# Patient Record
Sex: Male | Born: 1975 | Race: Black or African American | Hispanic: No | Marital: Single | Smoking: Current some day smoker
Health system: Southern US, Community
[De-identification: ages and names within clinical notes are randomized; demographics above are authoritative.]

## PROBLEM LIST (undated history)

## (undated) DIAGNOSIS — Z720 Tobacco use: Secondary | ICD-10-CM

## (undated) DIAGNOSIS — M549 Dorsalgia, unspecified: Secondary | ICD-10-CM

## (undated) DIAGNOSIS — I2699 Other pulmonary embolism without acute cor pulmonale: Secondary | ICD-10-CM

## (undated) HISTORY — DX: Tobacco use: Z72.0

## (undated) HISTORY — DX: Other pulmonary embolism without acute cor pulmonale: I26.99

## (undated) HISTORY — DX: Dorsalgia, unspecified: M54.9

## (undated) HISTORY — PX: CYST REMOVAL HAND: SHX6279

---

## 1998-05-05 ENCOUNTER — Encounter: Payer: Self-pay | Admitting: Emergency Medicine

## 1998-05-05 ENCOUNTER — Emergency Department (HOSPITAL_COMMUNITY): Admission: EM | Admit: 1998-05-05 | Discharge: 1998-05-05 | Payer: Self-pay | Admitting: Emergency Medicine

## 2000-06-18 ENCOUNTER — Ambulatory Visit (HOSPITAL_BASED_OUTPATIENT_CLINIC_OR_DEPARTMENT_OTHER): Admission: RE | Admit: 2000-06-18 | Discharge: 2000-06-18 | Payer: Self-pay | Admitting: Orthopedic Surgery

## 2000-06-18 ENCOUNTER — Encounter (INDEPENDENT_AMBULATORY_CARE_PROVIDER_SITE_OTHER): Payer: Self-pay | Admitting: Specialist

## 2004-01-21 DIAGNOSIS — I2699 Other pulmonary embolism without acute cor pulmonale: Secondary | ICD-10-CM

## 2004-01-21 HISTORY — DX: Other pulmonary embolism without acute cor pulmonale: I26.99

## 2007-06-16 ENCOUNTER — Emergency Department (HOSPITAL_COMMUNITY): Admission: EM | Admit: 2007-06-16 | Discharge: 2007-06-16 | Payer: Self-pay | Admitting: Emergency Medicine

## 2007-07-02 ENCOUNTER — Emergency Department (HOSPITAL_COMMUNITY): Admission: EM | Admit: 2007-07-02 | Discharge: 2007-07-02 | Payer: Self-pay | Admitting: Emergency Medicine

## 2010-08-01 ENCOUNTER — Emergency Department (HOSPITAL_BASED_OUTPATIENT_CLINIC_OR_DEPARTMENT_OTHER)
Admission: EM | Admit: 2010-08-01 | Discharge: 2010-08-01 | Disposition: A | Discharge status: Home / Self Care | Payer: No Typology Code available for payment source | Attending: Emergency Medicine | Admitting: Emergency Medicine

## 2010-08-01 DIAGNOSIS — S335XXA Sprain of ligaments of lumbar spine, initial encounter: Secondary | ICD-10-CM | POA: Insufficient documentation

## 2010-08-01 DIAGNOSIS — S139XXA Sprain of joints and ligaments of unspecified parts of neck, initial encounter: Secondary | ICD-10-CM | POA: Diagnosis not present

## 2010-08-01 DIAGNOSIS — S161XXA Strain of muscle, fascia and tendon at neck level, initial encounter: Secondary | ICD-10-CM

## 2010-08-01 DIAGNOSIS — M542 Cervicalgia: Secondary | ICD-10-CM | POA: Diagnosis present

## 2010-08-01 DIAGNOSIS — S39012A Strain of muscle, fascia and tendon of lower back, initial encounter: Secondary | ICD-10-CM

## 2010-08-01 DIAGNOSIS — Y92410 Unspecified street and highway as the place of occurrence of the external cause: Secondary | ICD-10-CM | POA: Insufficient documentation

## 2010-08-01 MED ORDER — CYCLOBENZAPRINE HCL 10 MG PO TABS: 10.0000 mg | ORAL_TABLET | Freq: Two times a day (BID) | ORAL | Status: AC | PRN

## 2010-08-01 MED ORDER — IBUPROFEN 800 MG PO TABS: 800.0000 mg | ORAL_TABLET | Freq: Three times a day (TID) | ORAL | Status: AC

## 2010-08-01 NOTE — ED Notes (Signed)
Awaiting MD evaluation. Warm blankets provided.
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
## 2011-05-31 ENCOUNTER — Emergency Department (HOSPITAL_BASED_OUTPATIENT_CLINIC_OR_DEPARTMENT_OTHER)
Admission: EM | Admit: 2011-05-31 | Discharge: 2011-05-31 | Disposition: A | Discharge status: Home Health | Payer: Managed Care, Other (non HMO) | Attending: Emergency Medicine | Admitting: Emergency Medicine

## 2011-05-31 ENCOUNTER — Emergency Department (INDEPENDENT_AMBULATORY_CARE_PROVIDER_SITE_OTHER): Payer: Managed Care, Other (non HMO)

## 2011-05-31 ENCOUNTER — Encounter (HOSPITAL_BASED_OUTPATIENT_CLINIC_OR_DEPARTMENT_OTHER): Payer: Self-pay | Admitting: *Deleted

## 2011-05-31 DIAGNOSIS — M25579 Pain in unspecified ankle and joints of unspecified foot: Secondary | ICD-10-CM

## 2011-05-31 DIAGNOSIS — R0602 Shortness of breath: Secondary | ICD-10-CM | POA: Insufficient documentation

## 2011-05-31 DIAGNOSIS — R0789 Other chest pain: Secondary | ICD-10-CM

## 2011-05-31 DIAGNOSIS — IMO0002 Reserved for concepts with insufficient information to code with codable children: Secondary | ICD-10-CM | POA: Insufficient documentation

## 2011-05-31 DIAGNOSIS — Y998 Other external cause status: Secondary | ICD-10-CM | POA: Insufficient documentation

## 2011-05-31 DIAGNOSIS — M7989 Other specified soft tissue disorders: Secondary | ICD-10-CM

## 2011-05-31 DIAGNOSIS — S90519A Abrasion, unspecified ankle, initial encounter: Secondary | ICD-10-CM

## 2011-05-31 DIAGNOSIS — R071 Chest pain on breathing: Secondary | ICD-10-CM | POA: Insufficient documentation

## 2011-05-31 DIAGNOSIS — R079 Chest pain, unspecified: Secondary | ICD-10-CM

## 2011-05-31 DIAGNOSIS — Y92410 Unspecified street and highway as the place of occurrence of the external cause: Secondary | ICD-10-CM | POA: Insufficient documentation

## 2011-05-31 MED ORDER — IBUPROFEN 800 MG PO TABS
800.0000 mg | ORAL_TABLET | Freq: Once | ORAL | Status: AC
  Administered 2011-05-31: 800 mg via ORAL
  Filled 2011-05-31: qty 1

## 2011-05-31 MED ORDER — TETANUS-DIPHTH-ACELL PERTUSSIS 5-2.5-18.5 LF-MCG/0.5 IM SUSP
0.5000 mL | Freq: Once | INTRAMUSCULAR | Status: AC
  Administered 2011-05-31: 0.5 mL via INTRAMUSCULAR
  Filled 2011-05-31: qty 0.5

## 2011-05-31 NOTE — Discharge Instructions (Signed)
Abrasions Abrasions are skin scrapes. Their treatment depends on how large and deep the abrasion is. Abrasions do not extend through all layers of the skin. A cut or lesion through all skin layers is called a laceration. HOME CARE INSTRUCTIONS   If you were given a dressing, change it at least once a day or as instructed by your caregiver. If the bandage sticks, soak it off with a solution of water or hydrogen peroxide.   Twice a day, wash the area with soap and water to remove all the cream/ointment. You may do this in a sink, under a tub faucet, or in a shower. Rinse off the soap and pat dry with a clean towel. Look for signs of infection (see below).   Reapply cream/ointment according to your caregiver's instruction. This will help prevent infection and keep the bandage from sticking. Telfa or gauze over the wound and under the dressing or wrap will also help keep the bandage from sticking.   If the bandage becomes wet, dirty, or develops a foul smell, change it as soon as possible.   Only take over-the-counter or prescription medicines for pain, discomfort, or fever as directed by your caregiver.  SEEK IMMEDIATE MEDICAL CARE IF:   Increasing pain in the wound.   Signs of infection develop: redness, swelling, surrounding area is tender to touch, or pus coming from the wound.   You have a fever.   Any foul smell coming from the wound or dressing.  Most skin wounds heal within ten days. Facial wounds heal faster. However, an infection may occur despite proper treatment. You should have the wound checked for signs of infection within 24 to 48 hours or sooner if problems arise. If you were not given a wound-check appointment, look closely at the wound yourself on the second day for early signs of infection listed above. MAKE SURE YOU:   Understand these instructions.   Will watch your condition.   Will get help right away if you are not doing well or get worse.  Document Released:  10/16/2004 Document Revised: 12/26/2010 Document Reviewed: 12/10/2010 [facility] Patient Information 2012 [place], [place].Chest Wall Pain Chest wall pain is pain in or around the bones and muscles of your chest. It may take up to 6 weeks to get better. It may take longer if you must stay physically active in your work and activities.  CAUSES  Chest wall pain may happen on its own. However, it may be caused by:  A viral illness like the flu.   Injury.   Coughing.   Exercise.   Arthritis.   Fibromyalgia.   Shingles.  HOME CARE INSTRUCTIONS   Avoid overtiring physical activity. Try not to strain or perform activities that cause pain. This includes any activities using your chest or your abdominal and side muscles, especially if heavy weights are used.   Put ice on the sore area.   Put ice in a plastic bag.   Place a towel between your skin and the bag.   Leave the ice on for 15 to 20 minutes per hour while awake for the first 2 days.   Only take over-the-counter or prescription medicines for pain, discomfort, or fever as directed by your caregiver.  SEEK IMMEDIATE MEDICAL CARE IF:   Your pain increases, or you are very uncomfortable.   You have a fever.   Your chest pain becomes worse.   You have new, unexplained symptoms.   You have nausea or vomiting.   You  feel sweaty or lightheaded.   You have a cough with phlegm (sputum), or you cough up blood.  MAKE SURE YOU:   Understand these instructions.   Will watch your condition.   Will get help right away if you are not doing well or get worse.  Document Released: 01/06/2005 Document Revised: 12/26/2010 Document Reviewed: 09/02/2010 [facility] Patient Information 2012 [place], [place].

## 2011-05-31 NOTE — ED Notes (Signed)
Patient transported to X-ray 

## 2011-05-31 NOTE — ED Notes (Signed)
The patient is undressed and in a gown. The bed rail is up and the call light is within reach. The bed is locked and in the lowest position. The patient is resting at this time.
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
## 2011-12-12 ENCOUNTER — Ambulatory Visit: Payer: Managed Care, Other (non HMO)

## 2011-12-12 ENCOUNTER — Ambulatory Visit (INDEPENDENT_AMBULATORY_CARE_PROVIDER_SITE_OTHER): Payer: Managed Care, Other (non HMO) | Admitting: Family Medicine

## 2011-12-12 VITALS — BP 126/80 | HR 69 | Temp 98.2°F | Resp 16 | Ht 68.5 in | Wt 168.6 lb

## 2011-12-12 DIAGNOSIS — S93409A Sprain of unspecified ligament of unspecified ankle, initial encounter: Secondary | ICD-10-CM

## 2011-12-12 DIAGNOSIS — M25579 Pain in unspecified ankle and joints of unspecified foot: Secondary | ICD-10-CM

## 2011-12-12 NOTE — Patient Instructions (Addendum)
You have a sprained ankle.  Wear the air cast as needed, and let us know if you are not getting better over the next several days.

## 2011-12-12 NOTE — Progress Notes (Signed)
[facility] [address], [place] [address] [phone]  Date:  12/12/2011   Name:  David Barker   DOB:  02-08-1975   MRN:  [id number]  PCP:  No primary provider on file.    Chief Complaint: Ankle Injury   History of Present Illness:  David Barker is a 36 y.o. very pleasant male patient who presents with the following:  About 2 weeks ago he inverted his left ankle on a step at his home.  He could not bear weight very easily but he did go to work after he hurt his ankle.  The ankle swelled but did not bruise.  It is still swollen.  He has had ankle sprains in the past.  Some have taken some time to heal.   He does have pain while he was working- by the end of his 12 hour shift the ankle is quite painful.    He works at [employer].  He has to carry heavy objects and walk a lot at his job.  When he is off the ankle it is tender but not too bad.    He is otherwise well and healthy   There is no problem list on file for this patient.   History reviewed. No pertinent past medical history.  History reviewed. No pertinent past surgical history.  History  Substance Use Topics  . Smoking status: Current Some Day Smoker  . Smokeless tobacco: Not on file  . Alcohol Use: Yes     Comment: occasional    Family History  Problem Relation Age of Onset  . Hypertension Paternal Grandmother     No Known Allergies  Medication list has been reviewed and updated.  Current Outpatient Prescriptions on File Prior to Visit  Medication Sig Dispense Refill  . ibuprofen (ADVIL,MOTRIN) 200 MG tablet Take 400 mg by mouth every 6 (six) hours as needed. Patient used this medication for pain.        Review of Systems:  As per HPI- otherwise negative.   Physical Examination: Filed Vitals:   12/12/11 1615  BP: 126/80  Pulse: 69  Temp: 98.2 F (36.8 C)  Resp: 16   Filed Vitals:   12/12/11 1615  Height: 5' 8.5" (1.74 m)  Weight: 168 lb 9.6 oz (76.476  kg)   Body mass index is 25.26 kg/(m^2). Ideal Body Weight: Weight in (lb) to have BMI = 25: 166.5    GEN: WDWN, NAD, Non-toxic, Alert & Oriented x 3 HEENT: Atraumatic, Normocephalic.  Ears and Nose: No external deformity. EXTR: No clubbing/cyanosis/edema NEURO: minimal favoring of left ankle PSYCH: Normally interactive. Conversant. Not depressed or anxious appearing.  Calm demeanor.  Left ankle: there is tenderness and mild swelling over the lateral malleolus.  No bruise.  No tenderness over the foot.  Normal foot perfusion, sensation and cap refill.  Normal DP pulse  [facility] reading (PRIMARY) by  Dr. Patsy Lager. Left ankle: negative Left foot: negative  LEFT FOOT - 2 VIEW  Comparison: The ankle films same date  Findings: AP and lateral views of the left foot. No oblique image submitted.  Given this mild limitation, no acute fracture or dislocation is identified. The base of the fifth metatarsal is intact.  IMPRESSION: No acute osseous abnormality. Slightly decreased sensitivity secondary to lack of oblique image.  LEFT ANKLE COMPLETE - 3+ VIEW  Comparison: None.  Findings: There is some soft tissue swelling about the lateral malleolus. No underlying bony or joint abnormality  is identified.  IMPRESSION: Soft tissue swelling without underlying bony or joint abnormality.   Assessment and Plan: 1. Ankle sprain  DG Ankle Complete Left, DG Foot 2 Views Left  2. Ankle pain     Placed air cast for support and did a note for work- he states that he will have to go out on FMLA and that requires a certain number of days out of work.  Wrote note for him to return to work on 12/23/11.  See patient instructions for more details.     Abbe Amsterdam, MD

## 2011-12-26 ENCOUNTER — Ambulatory Visit (INDEPENDENT_AMBULATORY_CARE_PROVIDER_SITE_OTHER): Payer: Managed Care, Other (non HMO) | Admitting: Family Medicine

## 2011-12-26 VITALS — BP 128/82 | HR 68 | Temp 98.1°F | Resp 16 | Ht 69.0 in | Wt 172.0 lb

## 2011-12-26 DIAGNOSIS — S93409A Sprain of unspecified ligament of unspecified ankle, initial encounter: Secondary | ICD-10-CM

## 2011-12-26 NOTE — Progress Notes (Signed)
[facility] [address], [place] [address] [phone]  Date:  12/26/2011   Name:  David Barker   DOB:  1975-09-19   MRN:  [id number]  PCP:  No primary provider on file.    Chief Complaint: Ankle Pain   History of Present Illness:  David Barker is a 36 y.o. very pleasant male patient who presents with the following:  He was seen here on 11/22 about 2 weeks after he had an ankle inversion injury.  The ankle was still swollen and it was hard to walk.  He had negative x-rays performed and was placed in an air- cast.  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  He continues to have pain and swelling  He has sprained his ankle in the past but never so severely.  He is trying to stay off of the ankle and is wearing his air- cast.     There is no problem list on file for this patient.   No past medical history on file.  No past surgical history on file.  History  Substance Use Topics  . Smoking status: Current Some Day Smoker  . Smokeless tobacco: Not on file  . Alcohol Use: Yes     Comment: occasional    Family History  Problem Relation Age of Onset  . Hypertension Paternal Grandmother     No Known Allergies  Medication list has been reviewed and updated.  Current Outpatient Prescriptions on File Prior to Visit  Medication Sig Dispense Refill  . ibuprofen (ADVIL,MOTRIN) 200 MG tablet Take 400 mg by mouth every 6 (six) hours as needed. Patient used this medication for pain.        Review of Systems:  As per HPI- otherwise negative.   Physical Examination: Filed Vitals:   12/26/11 1111  BP: 128/82  Pulse: 68  Temp: 98.1 F (36.7 C)  Resp: 16   Filed Vitals:   12/26/11 1111  Height: 5\' 9"  (1.753 m)  Weight: 172 lb (78.019 kg)   Body mass index is 25.40 kg/(m^2). Ideal Body Weight: Weight in (lb) to  have BMI = 25: 168.9    GEN: WDWN, NAD, Non-toxic, Alert & Oriented x 3 HEENT: Atraumatic, Normocephalic.  Ears and Nose: No external deformity. EXTR: No clubbing/cyanosis/edema NEURO: Normal gait.  PSYCH: Normally interactive. Conversant. Not depressed or anxious appearing.  Calm demeanor.  Left ankle: he continues to have tenderness and mild swelling over his lateral ankle, positive talar tilt test.  No bruise, normal perfusion, normal movement of foot and toes.  Achilles intact, normal flexion and extension of the ankle.    Assessment and Plan: 1. Ankle sprain  Ambulatory referral to Orthopedic Surgery   Persistent symptoms a month after an ankle sprain/ inversion injury. He is not able to RTW as his job is very physically demanding and he does not feel his ankle is stable for this type of work.  Will refer to ortho for further evaluation/ PT, continue air cast  Oskar Cretella, MD

## 2011-12-26 NOTE — Patient Instructions (Addendum)
We are going to refer you to orthopedics for further treatment of your ankle.  In the meantime continue to take it easy and wear your air- cast.  Let us know if anything gets worse

## 2012-07-30 ENCOUNTER — Ambulatory Visit (INDEPENDENT_AMBULATORY_CARE_PROVIDER_SITE_OTHER): Payer: Managed Care, Other (non HMO) | Admitting: Family Medicine

## 2012-07-30 VITALS — BP 122/74 | HR 71 | Temp 98.5°F | Resp 16 | Ht 69.5 in | Wt 162.0 lb

## 2012-07-30 DIAGNOSIS — M533 Sacrococcygeal disorders, not elsewhere classified: Secondary | ICD-10-CM

## 2012-07-30 DIAGNOSIS — S39012A Strain of muscle, fascia and tendon of lower back, initial encounter: Secondary | ICD-10-CM

## 2012-07-30 MED ORDER — IBUPROFEN 800 MG PO TABS: ORAL_TABLET | ORAL | Status: DC

## 2012-07-30 MED ORDER — METHOCARBAMOL 750 MG PO TABS: ORAL_TABLET | ORAL | Status: DC

## 2012-07-30 MED ORDER — HYDROCODONE-ACETAMINOPHEN 5-325 MG PO TABS: ORAL_TABLET | ORAL | Status: DC

## 2012-07-30 NOTE — Progress Notes (Signed)
Subjective

## 2012-07-30 NOTE — Patient Instructions (Signed)
Read the booklet on care of your back  Take ibuprofen 3 times daily  Take the muscle relaxants, methocarbamol, one in the morning, one in the afternoon, and 2 at bedtime  Use the hydrocodone for severe pain  Return in a week if not improved

## 2012-11-12 ENCOUNTER — Ambulatory Visit (INDEPENDENT_AMBULATORY_CARE_PROVIDER_SITE_OTHER): Payer: Managed Care, Other (non HMO) | Admitting: Emergency Medicine

## 2012-11-12 VITALS — BP 148/82 | HR 80 | Temp 98.5°F | Resp 16 | Ht 68.0 in | Wt 161.0 lb

## 2012-11-12 DIAGNOSIS — R300 Dysuria: Secondary | ICD-10-CM

## 2012-11-12 DIAGNOSIS — R369 Urethral discharge, unspecified: Secondary | ICD-10-CM

## 2012-11-12 LAB — POCT URINALYSIS DIPSTICK
Bilirubin, UA: NEGATIVE
Blood, UA: NEGATIVE
Glucose, UA: NEGATIVE
Ketones, UA: NEGATIVE
Leukocytes, UA: NEGATIVE
Nitrite, UA: NEGATIVE
Protein, UA: NEGATIVE
Spec Grav, UA: >=1.03
Urobilinogen, UA: 0.2
pH, UA: 6

## 2012-11-12 LAB — POCT UA - MICROSCOPIC ONLY
Casts, Ur, LPF, POC: NEGATIVE
Crystals, Ur, HPF, POC: NEGATIVE
Epithelial cells, urine per micros: NEGATIVE
Yeast, UA: NEGATIVE

## 2012-11-12 MED ORDER — CEFTRIAXONE SODIUM 1 G IJ SOLR
250.0000 mg | Freq: Once | INTRAMUSCULAR | Status: AC
  Administered 2012-11-12: 250 mg via INTRAMUSCULAR

## 2012-11-12 MED ORDER — AZITHROMYCIN 250 MG PO TABS: ORAL_TABLET | ORAL | Status: DC

## 2012-11-12 NOTE — Patient Instructions (Signed)
Urethritis, Adult  Urethritis is an inflammation (soreness) of the urethra (the tube exiting from the bladder). It is often caused by germs that may be spread through sexual contact.  TREATMENT   Urethritis will usually respond to antibiotics. These are medications that kill germs. Take all the medicine given to you. You may feel better in a couple days, but TAKE ALL MEDICINE or the infection may not be completely cured and may become more difficult to treat. Response can generally be expected in 7 to 10 days. You may require additional treatment after more testing.  HOME CARE INSTRUCTIONS   Not have sex until the test results are known and treatment is completed.   Know that you may be asked to notify your sex partner when your final test results are back.   Finish all medications as prescribed.   Prevent sexually transmitted infections including AIDS. Practice safe sex. Use condoms.  SEEK MEDICAL CARE IF:    Your symptoms are not improved in 2 to 3 days.   Your symptoms are getting worse.   Your develop abdominal pain.   You develop joint pain.  SEEK IMMEDIATE MEDICAL CARE IF:    You have a fever.   You develop severe pain in the belly, back or side.   You develop repeated vomiting.  TEST RESULTS  Not all test results are available during your visit. If your test results are not back during the visit, make an appointment with your caregiver to find out the results. Do not assume everything is normal if you have not heard from your caregiver or the medical facility. It is important for you to follow-up on all of your test results.  Document Released: 07/02/2000 Document Revised: 03/31/2011 Document Reviewed: 01/22/2009  [employer] Patient Information 2014 [employer]

## 2012-11-12 NOTE — Progress Notes (Signed)
   [address]   [place], [place]  [address]   [phone]  Subjective:    Patient ID: David Barker, male    DOB: 01-13-1976, 37 y.o.   MRN: [id number]  This chart was scribed for Lesle Chris, MD by Blanchard Kelch, ED Scribe. The patient was seen in room 12. Patient's care was started at 4:31 PM.   HPI  David Barker is a 37 y.o. male who presents to office complaining of penile discharge and dysuria that began a few days ago. He states that a partner he has been with for about two months recently went to the ED for vaginal bleeding and discharge. She was discharged with antibiotics for an STI, but she did not tell him which it was. He denies a history of any previous STIs. He last had blood work done for HIV and syphilis around January 2014. He would like to repeat the blood work today.   History reviewed. No pertinent past medical history. History reviewed. No pertinent past surgical history. Family History  Problem Relation Age of Onset  . Hypertension Paternal Grandmother   . Hypertension Mother    History   Social History  . Marital Status: Single    Spouse Name: N/A    Number of Children: N/A  . Years of Education: N/A   Occupational History  . Not on file.   Social History Main Topics  . Smoking status: Current Some Day Smoker -- 0.20 packs/day for 3 years    Types: Cigarettes  . Smokeless tobacco: Not on file  . Alcohol Use: Yes     Comment: occasional  . Drug Use: No  . Sexual Activity: No   Other Topics Concern  . Not on file   Social History Narrative  . No narrative on file   No Known Allergies Current Outpatient Prescriptions on File Prior to Visit  Medication Sig Dispense Refill  . HYDROcodone-acetaminophen (NORCO) 5-325 MG per tablet One every 4-6 hours for severe pain only.  15 tablet  0  . ibuprofen (ADVIL,MOTRIN) 200 MG tablet Take 400 mg by mouth every 6 (six) hours as needed. Patient used this medication for pain.      Marland Kitchen ibuprofen (ADVIL,MOTRIN) 800  MG tablet Take 1 pill 3 times daily as needed for back pain.  30 tablet  1  . methocarbamol (ROBAXIN-750) 750 MG tablet Take one in the morning, one in the afternoon, and 2 at bedtime for muscle relaxant  40 tablet  1   No current facility-administered medications on file prior to visit.     Review of Systems  Genitourinary: Positive for dysuria, discharge and penile pain.       Objective:   Physical Exam  Abdominal: Soft. There is no tenderness.  Genitourinary:  Penis and testicles normal. There is a slight whitish discharge from the meatus.           Assessment & Plan:  STD testing was done. He was given 250 of Rocephin IM and a prescription for Zithromax .  I personally performed the services described in this documentation, which was scribed in my presence. The recorded information has been reviewed and is accurate.

## 2012-11-13 LAB — RPR

## 2012-11-15 ENCOUNTER — Telehealth: Payer: Self-pay | Admitting: Radiology

## 2012-11-15 LAB — GC/CHLAMYDIA PROBE AMP: CT Probe RNA: POSITIVE — AB

## 2012-11-15 NOTE — Telephone Encounter (Signed)
Called him left message for him to call me back.  

## 2012-11-15 NOTE — Telephone Encounter (Signed)
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 If he wants to take medication to decrease his chance of spreading the infection we can call in about valtrex 500 mg to take one a day. He can have #30 tablets refills for one year ------

## 2012-11-25 NOTE — Telephone Encounter (Signed)
See lab/ this is done

## 2013-09-27 ENCOUNTER — Ambulatory Visit (INDEPENDENT_AMBULATORY_CARE_PROVIDER_SITE_OTHER): Payer: Managed Care, Other (non HMO)

## 2013-09-27 ENCOUNTER — Ambulatory Visit (INDEPENDENT_AMBULATORY_CARE_PROVIDER_SITE_OTHER): Payer: Managed Care, Other (non HMO) | Admitting: Family Medicine

## 2013-09-27 VITALS — BP 134/84 | HR 70 | Temp 97.7°F | Resp 16 | Ht 68.75 in | Wt 166.0 lb

## 2013-09-27 DIAGNOSIS — IMO0001 Reserved for inherently not codable concepts without codable children: Secondary | ICD-10-CM

## 2013-09-27 DIAGNOSIS — M545 Low back pain, unspecified: Secondary | ICD-10-CM

## 2013-09-27 DIAGNOSIS — M5380 Other specified dorsopathies, site unspecified: Secondary | ICD-10-CM

## 2013-09-27 DIAGNOSIS — M7918 Myalgia, other site: Secondary | ICD-10-CM

## 2013-09-27 DIAGNOSIS — M62830 Muscle spasm of back: Secondary | ICD-10-CM

## 2013-09-27 DIAGNOSIS — M629 Disorder of muscle, unspecified: Secondary | ICD-10-CM

## 2013-09-27 DIAGNOSIS — M6289 Other specified disorders of muscle: Secondary | ICD-10-CM

## 2013-09-27 MED ORDER — CYCLOBENZAPRINE HCL 5 MG PO TABS: ORAL_TABLET | ORAL | Status: DC

## 2013-09-27 MED ORDER — MELOXICAM 7.5 MG PO TABS: 7.5000 mg | ORAL_TABLET | Freq: Every day | ORAL | Status: DC

## 2013-09-27 NOTE — Patient Instructions (Signed)
Piriformis and hamstring stretches as discussed. You likely have a sprained ligament or strained muscle in the low back, which can lead to some muscle spasm as well. Piriformis muscle spasm can also cause "sciatica" symptoms - see more information below. Try the mobic each morning (do not combine with other over the counter pain relievers), flexeril at night if needed.  Heat or ice to area as needed and the other treatments and exercises in the back care manual as tolerated.  Recheck in next week if not improving, Return to the clinic or go to the nearest emergency room if any of your symptoms worsen or new symptoms occur.  Sciatica Sciatica is pain, weakness, numbness, or tingling along the path of the sciatic nerve. The nerve starts in the lower back and runs down the back of each leg. The nerve controls the muscles in the lower leg and in the back of the knee, while also providing sensation to the back of the thigh, lower leg, and the sole of your foot. Sciatica is a symptom of another medical condition. For instance, nerve damage or certain conditions, such as a herniated disk or bone spur on the spine, pinch or put pressure on the sciatic nerve. This causes the pain, weakness, or other sensations normally associated with sciatica. Generally, sciatica only affects one side of the body. CAUSES   Herniated or slipped disc.  Degenerative disk disease.  A pain disorder involving the narrow muscle in the buttocks (piriformis syndrome).  Pelvic injury or fracture.  Pregnancy.  Tumor (rare). SYMPTOMS  Symptoms can vary from mild to very severe. The symptoms usually travel from the low back to the buttocks and down the back of the leg. Symptoms can include:  Mild tingling or dull aches in the lower back, leg, or hip.  Numbness in the back of the calf or sole of the foot.  Burning sensations in the lower back, leg, or hip.  Sharp pains in the lower back, leg, or hip.  Leg weakness.  Severe  back pain inhibiting movement. These symptoms may get worse with coughing, sneezing, laughing, or prolonged sitting or standing. Also, being overweight may worsen symptoms. DIAGNOSIS  Your caregiver will perform a physical exam to look for common symptoms of sciatica. He or she may ask you to do certain movements or activities that would trigger sciatic nerve pain. Other tests may be performed to find the cause of the sciatica. These may include:  Blood tests.  X-rays.  Imaging tests, such as an MRI or CT scan. TREATMENT  Treatment is directed at the cause of the sciatic pain. Sometimes, treatment is not necessary and the pain and discomfort goes away on its own. If treatment is needed, your caregiver may suggest:  Over-the-counter medicines to relieve pain.  Prescription medicines, such as anti-inflammatory medicine, muscle relaxants, or narcotics.  Applying heat or ice to the painful area.  Steroid injections to lessen pain, irritation, and inflammation around the nerve.  Reducing activity during periods of pain.  Exercising and stretching to strengthen your abdomen and improve flexibility of your spine. Your caregiver may suggest losing weight if the extra weight makes the back pain worse.  Physical therapy.  Surgery to eliminate what is pressing or pinching the nerve, such as a bone spur or part of a herniated disk. HOME CARE INSTRUCTIONS   Only take over-the-counter or prescription medicines for pain or discomfort as directed by your caregiver.  Apply ice to the affected area for 20 minutes,  3-4 times a day for the first 48-72 hours. Then try heat in the same way.  Exercise, stretch, or perform your usual activities if these do not aggravate your pain.  Attend physical therapy sessions as directed by your caregiver.  Keep all follow-up appointments as directed by your caregiver.  Do not wear high heels or shoes that do not provide proper support.  Check your mattress to  see if it is too soft. A firm mattress may lessen your pain and discomfort. SEEK IMMEDIATE MEDICAL CARE IF:   You lose control of your bowel or bladder (incontinence).  You have increasing weakness in the lower back, pelvis, buttocks, or legs.  You have redness or swelling of your back.  You have a burning sensation when you urinate.  You have pain that gets worse when you lie down or awakens you at night.  Your pain is worse than you have experienced in the past.  Your pain is lasting longer than 4 weeks.  You are suddenly losing weight without reason. MAKE SURE YOU:  Understand these instructions.  Will watch your condition.  Will get help right away if you are not doing well or get worse. Document Released: 12/31/2000 Document Revised: 07/08/2011 Document Reviewed: 05/18/2011 [facility] Patient Information 2015 [place], [place]. This information is not intended to replace advice given to you by your health care provider. Make sure you discuss any questions you have with your health care provider. Back Pain, Adult Low back pain is very common. About 1 in 5 people have back pain.The cause of low back pain is rarely dangerous. The pain often gets better over time.About half of people with a sudden onset of back pain feel better in just 2 weeks. About 8 in 10 people feel better by 6 weeks.  CAUSES Some common causes of back pain include:  Strain of the muscles or ligaments supporting the spine.  Wear and tear (degeneration) of the spinal discs.  Arthritis.  Direct injury to the back. DIAGNOSIS Most of the time, the direct cause of low back pain is not known.However, back pain can be treated effectively even when the exact cause of the pain is unknown.Answering your caregiver's questions about your overall health and symptoms is one of the most accurate ways to make sure the cause of your pain is not dangerous. If your caregiver needs more information, he or she may order lab  work or imaging tests (X-rays or MRIs).However, even if imaging tests show changes in your back, this usually does not require surgery. HOME CARE INSTRUCTIONS For many people, back pain returns.Since low back pain is rarely dangerous, it is often a condition that people can learn to [facility] their own.   Remain active. It is stressful on the back to sit or stand in one place. Do not sit, drive, or stand in one place for more than 30 minutes at a time. Take short walks on level surfaces as soon as pain allows.Try to increase the length of time you walk each day.  Do not stay in bed.Resting more than 1 or 2 days can delay your recovery.  Do not avoid exercise or work.Your body is made to move.It is not dangerous to be active, even though your back may hurt.Your back will likely heal faster if you return to being active before your pain is gone.  Pay attention to your body when you bend and lift. Many people have less discomfortwhen lifting if they bend their knees, keep the load  close to their bodies,and avoid twisting. Often, the most comfortable positions are those that put less stress on your recovering back.  Find a comfortable position to sleep. Use a firm mattress and lie on your side with your knees slightly bent. If you lie on your back, put a pillow under your knees.  Only take over-the-counter or prescription medicines as directed by your caregiver. Over-the-counter medicines to reduce pain and inflammation are often the most helpful.Your caregiver may prescribe muscle relaxant drugs.These medicines help dull your pain so you can more quickly return to your normal activities and healthy exercise.  Put ice on the injured area.  Put ice in a plastic bag.  Place a towel between your skin and the bag.  Leave the ice on for 15-20 minutes, 03-04 times a day for the first 2 to 3 days. After that, ice and heat may be alternated to reduce pain and spasms.  Ask your caregiver about  trying back exercises and gentle massage. This may be of some benefit.  Avoid feeling anxious or stressed.Stress increases muscle tension and can worsen back pain.It is important to recognize when you are anxious or stressed and learn ways to manage it.Exercise is a great option. SEEK MEDICAL CARE IF:  You have pain that is not relieved with rest or medicine.  You have pain that does not improve in 1 week.  You have new symptoms.  You are generally not feeling well. SEEK IMMEDIATE MEDICAL CARE IF:   You have pain that radiates from your back into your legs.  You develop new bowel or bladder control problems.  You have unusual weakness or numbness in your arms or legs.  You develop nausea or vomiting.  You develop abdominal pain.  You feel faint. Document Released: 01/06/2005 Document Revised: 07/08/2011 Document Reviewed: 05/10/2013 [facility] Patient Information 2015 [place], [place]. This information is not intended to replace advice given to you by your health care provider. Make sure you discuss any questions you have with your health care provider.

## 2013-09-27 NOTE — Progress Notes (Signed)
Subjective:    Patient ID: David Barker, male    DOB: 01/26/1975, 38 y.o.   MRN: [id number]  HPI David Barker is a 38 y.o. male  C/o low back pain - lower back to behind L thigh and knee, and r side of lower back. Has had pain on and off for few years, but worse pain started about a month ago, NKI, just sore when woke up.  Slow to get up - able to loosen up once up and moving. No history of back surgery or known fractures. Few years ago had physical therapy for low back injury. Had been doing ok, then more sore past past few months.  Felt back pop 2 years ago with lifting - about a year ago, improved with rest form work, antiinflammatory.   No bowel or bladder incontinence, no saddle anesthesia, no lower extremity weakness.   Tx: warm shower.   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    There are no active problems to display for this patient.  History reviewed. No pertinent past medical history. History reviewed. No pertinent past surgical history. No Known Allergies Prior to Admission medications   Medication Sig Start Date End Date Taking? Authorizing Provider  azithromycin (ZITHROMAX) 250 MG tablet Take 4 tablets orally one dose 11/12/12   Collene Gobble, MD  HYDROcodone-acetaminophen (NORCO) 5-325 MG per tablet One every 4-6 hours for severe pain only. 07/30/12   Peyton Najjar, MD  ibuprofen (ADVIL,MOTRIN) 200 MG tablet Take 400 mg by mouth every 6 (six) hours as needed. Patient used this medication for pain.    Historical Provider, MD  ibuprofen (ADVIL,MOTRIN) 800 MG tablet Take 1 pill 3 times daily as needed for back pain. 07/30/12   Peyton Najjar, MD  methocarbamol (ROBAXIN-750) 750 MG tablet Take one in the morning, one in the afternoon, and 2 at bedtime for muscle relaxant 07/30/12   Peyton Najjar, MD   History   Social History  . Marital Status: Single    Spouse Name: N/A    Number of Children: N/A  . Years of Education: N/A   Occupational History    . Not on file.   Social History Main Topics  . Smoking status: Current Some Day Smoker -- 0.20 packs/day for 3 years    Types: Cigarettes  . Smokeless tobacco: Not on file  . Alcohol Use: Yes     Comment: occasional  . Drug Use: No  . Sexual Activity: No   Other Topics Concern  . Not on file   Social History Narrative  . No narrative on file     Review of Systems  Genitourinary: Negative for difficulty urinating.  Musculoskeletal: Positive for arthralgias, back pain and myalgias. Negative for gait problem.  Neurological: Negative for weakness.       Objective:   Physical Exam  Vitals reviewed. Constitutional: He appears well-developed and well-nourished.  HENT:  Head: Normocephalic and atraumatic.  Neck: Normal range of motion.  Pulmonary/Chest: Effort normal.  Abdominal: Soft. There is no tenderness.  Musculoskeletal: He exhibits tenderness.       Lumbar back: He exhibits tenderness and spasm. He exhibits normal range of motion and no bony tenderness.       Back:  Neurological: He is alert. He has normal strength. No sensory deficit.  Reflex Scores:      Patellar reflexes are 2+ on the right side and 2+ on the left side.  Achilles reflexes are 2+ on the right side and 2+ on the left side. Able to heel and toe walk without difficulty.  Skin: Skin is warm and dry.  Psychiatric: He has a normal mood and affect. His behavior is normal.    Filed Vitals:   09/27/13 1521  BP: 134/84  Pulse: 70  Temp: 97.7 F (36.5 C)  TempSrc: Oral  Resp: 16  Height: 5' 8.75" (1.746 m)  Weight: 166 lb (75.297 kg)  SpO2: 100%   [facility] reading (PRIMARY) by  Dr. Neva Seat: LS spine: no acute findings.      Assessment & Plan:   David Barker is a 38 y.o. male Low back pain, unspecified back pain laterality, with sciatica presence unspecified - Plan: DG Lumbar Spine Complete, cyclobenzaprine (FLEXERIL) 5 MG tablet, meloxicam (MOBIC) 7.5 MG tablet  Muscle spasm of back - Plan:  cyclobenzaprine (FLEXERIL) 5 MG tablet, meloxicam (MOBIC) 7.5 MG tablet  Hamstring tightness of left lower extremity - Plan: cyclobenzaprine (FLEXERIL) 5 MG tablet, meloxicam (MOBIC) 7.5 MG tablet  Piriformis muscle pain - Plan: cyclobenzaprine (FLEXERIL) 5 MG tablet, meloxicam (MOBIC) 7.5 MG tablet  Acute on chronic LBP by history. Prior back injuries with episodic improvement, then worsening. Sx's initially suspicious for sciatica, but on exam, may have piriformis syndrome component on left, with tight hamstrings possibly from adjusting his lifting technique with outstretching L leg back when bending. Reassuring exam otherwise and no apparent pars defect on XR, but sent for overread.   -stretches per back care manual and taught hamstring and piriformis stretches in office.   -mobic, flexeril if needed- SED  - OOW note given for 1 week, but if persistent pain - may need MRI, formal PT,  or ortho eval.   -RTC precautions.     Meds ordered this encounter  Medications  . cyclobenzaprine (FLEXERIL) 5 MG tablet    Sig: 1 pill by mouth up to every 8 hours as needed. Start with one pill by mouth each bedtime as needed due to sedation    Dispense:  15 tablet    Refill:  0  . meloxicam (MOBIC) 7.5 MG tablet    Sig: Take 1-2 tablets (7.5-15 mg total) by mouth daily.    Dispense:  30 tablet    Refill:  0   Patient Instructions  Piriformis and hamstring stretches as discussed. You likely have a sprained ligament or strained muscle in the low back, which can lead to some muscle spasm as well. Piriformis muscle spasm can also cause "sciatica" symptoms - see more information below. Try the mobic each morning (do not combine with other over the counter pain relievers), flexeril at night if needed.  Heat or ice to area as needed and the other treatments and exercises in the back care manual as tolerated.  Recheck in next week if not improving, Return to the clinic or go to the nearest emergency room if  any of your symptoms worsen or new symptoms occur.  Sciatica Sciatica is pain, weakness, numbness, or tingling along the path of the sciatic nerve. The nerve starts in the lower back and runs down the back of each leg. The nerve controls the muscles in the lower leg and in the back of the knee, while also providing sensation to the back of the thigh, lower leg, and the sole of your foot. Sciatica is a symptom of another medical condition. For instance, nerve damage or certain conditions, such as a herniated disk or bone spur on  the spine, pinch or put pressure on the sciatic nerve. This causes the pain, weakness, or other sensations normally associated with sciatica. Generally, sciatica only affects one side of the body. CAUSES   Herniated or slipped disc.  Degenerative disk disease.  A pain disorder involving the narrow muscle in the buttocks (piriformis syndrome).  Pelvic injury or fracture.  Pregnancy.  Tumor (rare). SYMPTOMS  Symptoms can vary from mild to very severe. The symptoms usually travel from the low back to the buttocks and down the back of the leg. Symptoms can include:  Mild tingling or dull aches in the lower back, leg, or hip.  Numbness in the back of the calf or sole of the foot.  Burning sensations in the lower back, leg, or hip.  Sharp pains in the lower back, leg, or hip.  Leg weakness.  Severe back pain inhibiting movement. These symptoms may get worse with coughing, sneezing, laughing, or prolonged sitting or standing. Also, being overweight may worsen symptoms. DIAGNOSIS  Your caregiver will perform a physical exam to look for common symptoms of sciatica. He or she may ask you to do certain movements or activities that would trigger sciatic nerve pain. Other tests may be performed to find the cause of the sciatica. These may include:  Blood tests.  X-rays.  Imaging tests, such as an MRI or CT scan. TREATMENT  Treatment is directed at the cause of the  sciatic pain. Sometimes, treatment is not necessary and the pain and discomfort goes away on its own. If treatment is needed, your caregiver may suggest:  Over-the-counter medicines to relieve pain.  Prescription medicines, such as anti-inflammatory medicine, muscle relaxants, or narcotics.  Applying heat or ice to the painful area.  Steroid injections to lessen pain, irritation, and inflammation around the nerve.  Reducing activity during periods of pain.  Exercising and stretching to strengthen your abdomen and improve flexibility of your spine. Your caregiver may suggest losing weight if the extra weight makes the back pain worse.  Physical therapy.  Surgery to eliminate what is pressing or pinching the nerve, such as a bone spur or part of a herniated disk. HOME CARE INSTRUCTIONS   Only take over-the-counter or prescription medicines for pain or discomfort as directed by your caregiver.  Apply ice to the affected area for 20 minutes, 3-4 times a day for the first 48-72 hours. Then try heat in the same way.  Exercise, stretch, or perform your usual activities if these do not aggravate your pain.  Attend physical therapy sessions as directed by your caregiver.  Keep all follow-up appointments as directed by your caregiver.  Do not wear high heels or shoes that do not provide proper support.  Check your mattress to see if it is too soft. A firm mattress may lessen your pain and discomfort. SEEK IMMEDIATE MEDICAL CARE IF:   You lose control of your bowel or bladder (incontinence).  You have increasing weakness in the lower back, pelvis, buttocks, or legs.  You have redness or swelling of your back.  You have a burning sensation when you urinate.  You have pain that gets worse when you lie down or awakens you at night.  Your pain is worse than you have experienced in the past.  Your pain is lasting longer than 4 weeks.  You are suddenly losing weight without  reason. MAKE SURE YOU:  Understand these instructions.  Will watch your condition.  Will get help right away if you are not  doing well or get worse. Document Released: 12/31/2000 Document Revised: 07/08/2011 Document Reviewed: 05/18/2011 [facility] Patient Information 2015 [place], [place]. This information is not intended to replace advice given to you by your health care provider. Make sure you discuss any questions you have with your health care provider. Back Pain, Adult Low back pain is very common. About 1 in 5 people have back pain.The cause of low back pain is rarely dangerous. The pain often gets better over time.About half of people with a sudden onset of back pain feel better in just 2 weeks. About 8 in 10 people feel better by 6 weeks.  CAUSES Some common causes of back pain include:  Strain of the muscles or ligaments supporting the spine.  Wear and tear (degeneration) of the spinal discs.  Arthritis.  Direct injury to the back. DIAGNOSIS Most of the time, the direct cause of low back pain is not known.However, back pain can be treated effectively even when the exact cause of the pain is unknown.Answering your caregiver's questions about your overall health and symptoms is one of the most accurate ways to make sure the cause of your pain is not dangerous. If your caregiver needs more information, he or she may order lab work or imaging tests (X-rays or MRIs).However, even if imaging tests show changes in your back, this usually does not require surgery. HOME CARE INSTRUCTIONS For many people, back pain returns.Since low back pain is rarely dangerous, it is often a condition that people can learn to [facility] their own.   Remain active. It is stressful on the back to sit or stand in one place. Do not sit, drive, or stand in one place for more than 30 minutes at a time. Take short walks on level surfaces as soon as pain allows.Try to increase the length of time you walk each  day.  Do not stay in bed.Resting more than 1 or 2 days can delay your recovery.  Do not avoid exercise or work.Your body is made to move.It is not dangerous to be active, even though your back may hurt.Your back will likely heal faster if you return to being active before your pain is gone.  Pay attention to your body when you bend and lift. Many people have less discomfortwhen lifting if they bend their knees, keep the load close to their bodies,and avoid twisting. Often, the most comfortable positions are those that put less stress on your recovering back.  Find a comfortable position to sleep. Use a firm mattress and lie on your side with your knees slightly bent. If you lie on your back, put a pillow under your knees.  Only take over-the-counter or prescription medicines as directed by your caregiver. Over-the-counter medicines to reduce pain and inflammation are often the most helpful.Your caregiver may prescribe muscle relaxant drugs.These medicines help dull your pain so you can more quickly return to your normal activities and healthy exercise.  Put ice on the injured area.  Put ice in a plastic bag.  Place a towel between your skin and the bag.  Leave the ice on for 15-20 minutes, 03-04 times a day for the first 2 to 3 days. After that, ice and heat may be alternated to reduce pain and spasms.  Ask your caregiver about trying back exercises and gentle massage. This may be of some benefit.  Avoid feeling anxious or stressed.Stress increases muscle tension and can worsen back pain.It is important to recognize when you are anxious or stressed and  learn ways to manage it.Exercise is a great option. SEEK MEDICAL CARE IF:  You have pain that is not relieved with rest or medicine.  You have pain that does not improve in 1 week.  You have new symptoms.  You are generally not feeling well. SEEK IMMEDIATE MEDICAL CARE IF:   You have pain that radiates from your back into  your legs.  You develop new bowel or bladder control problems.  You have unusual weakness or numbness in your arms or legs.  You develop nausea or vomiting.  You develop abdominal pain.  You feel faint. Document Released: 01/06/2005 Document Revised: 07/08/2011 Document Reviewed: 05/10/2013 [facility] Patient Information 2015 [place], [place]. This information is not intended to replace advice given to you by your health care provider. Make sure you discuss any questions you have with your health care provider.

## 2014-03-04 ENCOUNTER — Ambulatory Visit (INDEPENDENT_AMBULATORY_CARE_PROVIDER_SITE_OTHER): Payer: Managed Care, Other (non HMO) | Admitting: Family Medicine

## 2014-03-04 ENCOUNTER — Ambulatory Visit (INDEPENDENT_AMBULATORY_CARE_PROVIDER_SITE_OTHER): Payer: Managed Care, Other (non HMO)

## 2014-03-04 VITALS — BP 132/78 | HR 81 | Temp 98.1°F | Resp 18 | Ht 68.0 in | Wt 168.8 lb

## 2014-03-04 DIAGNOSIS — M79672 Pain in left foot: Secondary | ICD-10-CM

## 2014-03-04 DIAGNOSIS — S93602A Unspecified sprain of left foot, initial encounter: Secondary | ICD-10-CM

## 2014-03-04 MED ORDER — HYDROCODONE-ACETAMINOPHEN 5-325 MG PO TABS: 1.0000 | ORAL_TABLET | Freq: Four times a day (QID) | ORAL | Status: DC | PRN

## 2014-03-04 MED ORDER — NAPROXEN 500 MG PO TABS: 500.0000 mg | ORAL_TABLET | Freq: Two times a day (BID) | ORAL | Status: DC

## 2014-03-04 NOTE — Patient Instructions (Signed)
Wear the Cam Walker whenever you are up and around  Take naproxen one twice daily  Take hydrocodone only as needed for severe pain so you can rest  Apply ice 3 or 4 times daily to the foot for the next few days  Plan to return on Monday, February 22 for a recheck. If you're doing much better before then, please come on in and one of the others can see you and give you clearance to return to work.

## 2014-03-04 NOTE — Progress Notes (Signed)
Subjective: 39 year old who was playing some ball and got hit in such an angle that his left foot got hyperflexed.Marland Kitchen. He had pain on the top of the left foot and down the great toe. He felt something pop. It is been pretty painful and he has a hard time walking on it. 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 He wears work boots there, but has to bear a lot of weight.  Objective: No obvious discoloration. He is swollen on the top of the foot, right at the midfoot level. He's very tender on the proximal first metatarsal and distal first metatarsal and MTP joint of the first toe. He's very tender over the dorsum of the midfoot.  Assessment: Left foot injury and pain  Plan: X-ray left foot  Results for orders placed or performed in visit on 11/12/12  GC/Chlamydia Probe Amp  Result Value Ref Range   CT Probe RNA POSITIVE (A)    GC Probe RNA NEGATIVE   HIV antibody  Result Value Ref Range   HIV NON REACTIVE NON REACTIVE  RPR  Result Value Ref Range   RPR Ser Ql NON REAC NON REAC  Hepatitis C antibody  Result Value Ref Range   HCV Ab NEGATIVE NEGATIVE  HSV(herpes simplex vrs) 1+2 ab-IgG  Result Value Ref Range   HSV 1 Glycoprotein G Ab, IgG 8.16 (H) IV   HSV 2 Glycoprotein G Ab, IgG 6.03 (H) IV  POCT UA - Microscopic Only  Result Value Ref Range   WBC, Ur, HPF, POC 2-5    RBC, urine, microscopic 0-1    Bacteria, U Microscopic trace    Mucus, UA moderate    Epithelial cells, urine per micros neg    Crystals, Ur, HPF, POC neg    Casts, Ur, LPF, POC neg    Yeast, UA neg   POCT urinalysis dipstick  Result Value Ref Range   Color, UA yellow    Clarity, UA clear    Glucose, UA neg    Bilirubin, UA neg    Ketones, UA neg    Spec Grav, UA >=1.030    Blood, UA neg    pH, UA 6.0    Protein, UA neg    Urobilinogen, UA 0.2    Nitrite, UA neg    Leukocytes, UA Negative    [facility] reading (PRIMARY) by  Dr. Alwyn RenHopper No  fracture  Assessment: Midfoot sprain left foot  Plan: He has a cam walker which he is going to wear. This will take at least a week probably to heal up. I will see him back when I return in 10 days, but if he is doing much better he can come in sooner get clearance to work. He just works on the weekends..Marland Kitchen

## 2014-03-13 ENCOUNTER — Ambulatory Visit (INDEPENDENT_AMBULATORY_CARE_PROVIDER_SITE_OTHER): Payer: Managed Care, Other (non HMO) | Admitting: Family Medicine

## 2014-03-13 ENCOUNTER — Ambulatory Visit (INDEPENDENT_AMBULATORY_CARE_PROVIDER_SITE_OTHER): Payer: Managed Care, Other (non HMO)

## 2014-03-13 VITALS — BP 124/90 | HR 71 | Temp 97.8°F | Resp 16 | Ht 68.0 in | Wt 168.2 lb

## 2014-03-13 DIAGNOSIS — S93602A Unspecified sprain of left foot, initial encounter: Secondary | ICD-10-CM

## 2014-03-13 DIAGNOSIS — M25572 Pain in left ankle and joints of left foot: Secondary | ICD-10-CM

## 2014-03-13 DIAGNOSIS — M79672 Pain in left foot: Secondary | ICD-10-CM

## 2014-03-13 MED ORDER — HYDROCODONE-ACETAMINOPHEN 5-325 MG PO TABS: 1.0000 | ORAL_TABLET | Freq: Four times a day (QID) | ORAL | Status: DC | PRN

## 2014-03-13 MED ORDER — NAPROXEN 500 MG PO TABS: 500.0000 mg | ORAL_TABLET | Freq: Two times a day (BID) | ORAL | Status: DC

## 2014-03-13 NOTE — Patient Instructions (Signed)
Wear ankle splint when you are going to be up and around, but try and minimize excessive walking or climbing or anything that would cause more stress on that foot.  I will let you know if the radiologist sees anything differently about the ankle.  Hopefully you will be well enough to work again in about 11 or 12 days.

## 2014-03-13 NOTE — Progress Notes (Signed)
Subjective: Continues to hurt in the left foot. He is able to bear weight and walk. However he does not feel like he could put up with a 12 her shift on that foot at this time. The radiologist did not see any x-ray fracture on prior films.  Slowly improving  Objective: Ankle joint itself has good range of motion and does not seem particularly painful. Just distal to the lateral malleolus through to the distal part of the calcaneus is where he seems to be tender. The pain seems to be deep in the middle of the foot.  [facility] reading (PRIMARY) by  Dr. Alwyn RenHopper Normal foot  Assessment: Foot pain, mid foot, slow improvement  Plan  More rest of foot. OOW 10 more days

## 2014-03-23 ENCOUNTER — Ambulatory Visit (INDEPENDENT_AMBULATORY_CARE_PROVIDER_SITE_OTHER): Payer: Managed Care, Other (non HMO) | Admitting: Family Medicine

## 2014-03-23 VITALS — BP 130/100 | HR 70 | Temp 98.2°F | Resp 16 | Ht 68.0 in | Wt 167.0 lb

## 2014-03-23 DIAGNOSIS — M25572 Pain in left ankle and joints of left foot: Secondary | ICD-10-CM

## 2014-03-23 DIAGNOSIS — M79672 Pain in left foot: Secondary | ICD-10-CM

## 2014-03-23 MED ORDER — NAPROXEN 500 MG PO TABS: 500.0000 mg | ORAL_TABLET | Freq: Two times a day (BID) | ORAL | Status: DC

## 2014-03-23 NOTE — Patient Instructions (Signed)
Return to work on Monday.  If having a lot of pain still by Tuesday contact me and we will make a referral to sports medicine. I do not see need to see you that day, but if you have not gotten into sports medicine and continued to hurt over the next couple of weeks you will have to return.   If you are unable to work the following weekend you'll need to return.

## 2014-03-23 NOTE — Progress Notes (Signed)
Subjective: He has improved. He can walk regularly and walk up on his toes if necessary. However if he turns his foot at a certain angle, especially inverting it, he can experience severe sharp pain in the deep tissues medial to the arch. He also has some deep aching in the left ankle joint itself.  Objective: Nontender to touch when I inverted the foot got a sudden severe pain in the arch area medially. It is a deep pain.  Assessment: Strain and sprain of ankle and foot with significant pain at times.  Plan: Lateral try to return to work on Monday. If this does not help we will make referral to the [facility] medicine clinic at [facility] for further evaluation might include ultrasound diagnostics of exactly where the problem is coming from.

## 2015-01-06 ENCOUNTER — Ambulatory Visit (INDEPENDENT_AMBULATORY_CARE_PROVIDER_SITE_OTHER): Payer: Managed Care, Other (non HMO) | Admitting: Family Medicine

## 2015-01-06 VITALS — BP 130/90 | HR 76 | Temp 98.2°F | Resp 16 | Ht 69.0 in | Wt 168.0 lb

## 2015-01-06 DIAGNOSIS — S46812A Strain of other muscles, fascia and tendons at shoulder and upper arm level, left arm, initial encounter: Secondary | ICD-10-CM | POA: Diagnosis not present

## 2015-01-06 DIAGNOSIS — S29011A Strain of muscle and tendon of front wall of thorax, initial encounter: Secondary | ICD-10-CM | POA: Diagnosis not present

## 2015-01-06 MED ORDER — TRAMADOL HCL 50 MG PO TABS: 50.0000 mg | ORAL_TABLET | Freq: Three times a day (TID) | ORAL | Status: DC | PRN

## 2015-01-06 MED ORDER — CYCLOBENZAPRINE HCL 5 MG PO TABS: ORAL_TABLET | ORAL | Status: DC

## 2015-01-06 MED ORDER — NAPROXEN 500 MG PO TABS: 500.0000 mg | ORAL_TABLET | Freq: Two times a day (BID) | ORAL | Status: DC

## 2015-01-06 NOTE — Patient Instructions (Signed)
Take cyclobenzaprine 5 mg one in the morning, 1 in the afternoon, and one or 2 at bedtime for muscle relaxant  Take naproxen 500 mg twice daily for pain and inflammation  Take tramadol 50 mg 1 every 6 hours only as needed for more severe pain  Due the limbering up exercises as discussed  If getting worse at any time return, or if not continuing to improve next week return.

## 2015-01-06 NOTE — Progress Notes (Signed)
Patient ID: David Barker, male    DOB: 12/11/1975  Age: 39 y.o. MRN: [id number]  Chief Complaint  Patient presents with  . Fall    last night, left neck/shoulder pain    Subjective:   39 year old man who came out of the party late last night and the sidewalk was slick. He fell down hard hitting his left elbow and jamming his shoulder. He has been having pain today in the left shoulder and upper back area. He really does not hurt that much in the elbow itself. He does have some pain in the upper sternal area chest that is deep hurting, especially when he stretches it feels like it needs to pop. He works [employer], so does physical activity all day. He had a leftover muscle relaxant from last spring and he took 1. He's not sure, may have been Flexeril. He is not taking any other anti-inflammatory pain reliever. 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  Current allergies, medications, problem list, past/family and social histories reviewed.  Objective:  BP 130/90 mmHg  Pulse 76  Temp(Src) 98.2 F (36.8 C)  Resp 16  Ht 5\' 9"  (1.753 m)  Wt 168 lb (76.204 kg)  BMI 24.80 kg/m2  SpO2 98%  Very tender in the trapezius. It hurts in the trapezius area whenever he twists or bends his neck. Range of motion of the neck is satisfactory but causes pain. The pain is not in the neck itself, but down toward the shoulder. He has pain just medial to the shoulder girdle behind the before meals area along the soft tissues. Upper arm, shoulder girdle, and elbow are satisfactory. Has trouble with full abduction of the left shoulder. Hurts when he raises it up all the way. Neuro Vascular intact however  Assessment & Plan:   Assessment: 1. Trapezius strain, left, initial encounter   2. Strain of chest wall, initial encounter       Plan: Soft tissue injuries, no x-rays needed at this time.    Patient Instructions  Take cyclobenzaprine 5 mg one in the morning, 1 in the afternoon, and one or 2 at bedtime for muscle  relaxant  Take naproxen 500 mg twice daily for pain and inflammation  Take tramadol 50 mg 1 every 6 hours only as needed for more severe pain  Due the limbering up exercises as discussed  If getting worse at any time return, or if not continuing to improve next week return.     Return if symptoms worsen or fail to improve.   HOPPER,DAVID, MD 01/06/2015

## 2015-08-16 ENCOUNTER — Ambulatory Visit (INDEPENDENT_AMBULATORY_CARE_PROVIDER_SITE_OTHER): Payer: Managed Care, Other (non HMO) | Admitting: Physician Assistant

## 2015-08-16 VITALS — BP 110/70 | HR 71 | Temp 98.6°F | Resp 18 | Ht 69.0 in | Wt 168.4 lb

## 2015-08-16 DIAGNOSIS — L259 Unspecified contact dermatitis, unspecified cause: Secondary | ICD-10-CM

## 2015-08-16 DIAGNOSIS — K130 Diseases of lips: Secondary | ICD-10-CM | POA: Diagnosis not present

## 2015-08-16 DIAGNOSIS — R220 Localized swelling, mass and lump, head: Secondary | ICD-10-CM | POA: Insufficient documentation

## 2015-08-16 LAB — POCT CBC
GRANULOCYTE PERCENT: 73.9 % (ref 37–80)
HCT, POC: 43.4 % — AB (ref 43.5–53.7)
Hemoglobin: 15.2 g/dL (ref 14.1–18.1)
LYMPH, POC: 2 (ref 0.6–3.4)
MCH, POC: 32.2 pg — AB (ref 27–31.2)
MCHC: 35 g/dL (ref 31.8–35.4)
MCV: 92 fL (ref 80–97)
MID (CBC): 0.3 (ref 0–0.9)
MPV: 7.7 fL (ref 0–99.8)
PLATELET COUNT, POC: 208 10*3/uL (ref 142–424)
POC Granulocyte: 6.7 (ref 2–6.9)
POC LYMPH %: 22.5 % (ref 10–50)
POC MID %: 3.6 %M (ref 0–12)
RBC: 4.72 M/uL (ref 4.69–6.13)
RDW, POC: 14.2 %
WBC: 9.1 10*3/uL (ref 4.6–10.2)

## 2015-08-16 MED ORDER — CETIRIZINE HCL 10 MG PO TABS: 10.0000 mg | ORAL_TABLET | Freq: Two times a day (BID) | ORAL | 0 refills | Status: DC

## 2015-08-16 NOTE — Progress Notes (Signed)
Patient ID: David Barker, male    DOB: 1975/09/05, 40 y.o.   MRN: [id number]  PCP: No PCP Per Patient  Chief Complaint  Patient presents with  . Oral Swelling    since yesterday, lower right side of bottom lip    Subjective:   HPI:  89 yom healthy male presents with above complaint.   Awoke yesterday am, felt inside of right lower lip was sore. Persisted through day. Then this morning awoke and same area of lip was swollen. Presented to UC. Denies fevers, chills, tongue swelling, throat itching or swelling, trouble breathing or swallowing. Has never had this before. Denies any new meds or foods.   Has recently started working around different solvents at work within the past few days to week. Admits to placing several [place] dips per day in right lower lip while at work. Did place several dips 2 days ago in same location as today's swelling, placed dips with gloves on that had been exposed to new solvents while at work.   Took ibuprofen this am otherwise nothing.   There are no active problems to display for this patient.   History reviewed. No pertinent past medical history.   Prior to Admission medications   Medication Sig Start Date End Date Taking? Authorizing Provider  cyclobenzaprine (FLEXERIL) 5 MG tablet 1 pill by mouth up to every 8 hours as needed. Start with one pill by mouth each bedtime as needed due to sedation Patient not taking: Reported on 08/16/2015 01/06/15   Peyton Najjar, MD  HYDROcodone-acetaminophen (NORCO) 5-325 MG per tablet Take 1 tablet by mouth every 6 (six) hours as needed. Patient not taking: Reported on 01/06/2015 03/13/14   Peyton Najjar, MD  meloxicam (MOBIC) 7.5 MG tablet Take 1-2 tablets (7.5-15 mg total) by mouth daily. Patient not taking: Reported on 03/04/2014 09/27/13   Shade Flood, MD  naproxen (NAPROSYN) 500 MG tablet Take 1 tablet (500 mg total) by mouth 2 (two) times daily with a meal. Patient not taking: Reported on 08/16/2015  01/06/15   Peyton Najjar, MD  traMADol (ULTRAM) 50 MG tablet Take 1 tablet (50 mg total) by mouth every 8 (eight) hours as needed. Patient not taking: Reported on 08/16/2015 01/06/15   Peyton Najjar, MD    No Known Allergies  History reviewed. No pertinent surgical history.  Family History  Problem Relation Age of Onset  . Hypertension Paternal Grandmother   . Hypertension Mother     Social History   Social History  . Marital status: Single    Spouse name: N/A  . Number of children: N/A  . Years of education: N/A   Social History Main Topics  . Smoking status: Current Some Day Smoker    Packs/day: 0.20    Years: 3.00    Types: Cigarettes  . Smokeless tobacco: Never Used  . Alcohol use 0.0 oz/week     Comment: occasional  . Drug use: No  . Sexual activity: No   Other Topics Concern  . None   Social History Narrative  . None       Review of Systems See HPI. No abd pain, sob, cough, n/v, diarrhea.      Objective:  Physical Exam  Constitutional: He appears well-developed and well-nourished.  Non-toxic appearance. He does not have a sickly appearance. He does not appear ill. No distress.  HENT:  Mouth/Throat: Oropharynx is clear and moist and mucous membranes are normal. No oral lesions. No uvula  swelling or lacerations.    Mild right lower lip swelling over circled area. No tongue or throat swelling. No uvular swelling. No signs infection or trauma to area.   Cardiovascular: Normal rate, regular rhythm and normal heart sounds.   Pulmonary/Chest: Effort normal and breath sounds normal. No tachypnea.  Lymphadenopathy:       Head (right side): No submental, no submandibular and no tonsillar adenopathy present.       Head (left side): No submental, no submandibular and no tonsillar adenopathy present.    Results for orders placed or performed in visit on 08/16/15  POCT CBC  Result Value Ref Range   WBC 9.1 4.6 - 10.2 K/uL   Lymph, poc 2.0 0.6 - 3.4   POC  LYMPH PERCENT 22.5 10 - 50 %L   MID (cbc) 0.3 0 - 0.9   POC MID % 3.6 0 - 12 %M   POC Granulocyte 6.7 2 - 6.9   Granulocyte percent 73.9 37 - 80 %G   RBC 4.72 4.69 - 6.13 M/uL   Hemoglobin 15.2 14.1 - 18.1 g/dL   HCT, POC 34.7 (A) 42.5 - 53.7 %   MCV 92.0 80 - 97 fL   MCH, POC 32.2 (A) 27 - 31.2 pg   MCHC 35.0 31.8 - 35.4 g/dL   RDW, POC 95.6 %   Platelet Count, POC 208 142 - 424 K/uL   MPV 7.7 0 - 99.8 fL       Assessment & Plan:   Contact dermatitis  Lip swelling - Plan: POCT CBC --suspect swelling from unknown exposure, possibly from touching area after contact with new chemicals at work --more likely contact dermatitis causing swelling, doubt angioedema as was not sudden onset and discomfort presented approx 24 hrs prior to swelling  --no concern for airway insult  --cetirizine bid for one week, tylenol for pain  --rtc if worsening, no improvement 1-2 weeks, or trouble with airway   Donnajean Lopes, PA-C Physician Assistant-Certified [facility]  08/16/2015 8:58 AM

## 2015-08-16 NOTE — Patient Instructions (Addendum)
Your white blood cell count was normal meaning that this is likely not an infection. Please take the cetirizine twice daily for 7 days or until swelling subsides.  Try to keep an eye out at work for any new exposures you may be having.  Stopping tobacco usage is important! If you're not better in 7 days, or if you start having tongue swelling, trouble breathing, or fevers or chills come back to see [place] asap or call 911!

## 2016-05-02 ENCOUNTER — Ambulatory Visit (INDEPENDENT_AMBULATORY_CARE_PROVIDER_SITE_OTHER): Payer: 59 | Admitting: Adult Health

## 2016-05-02 ENCOUNTER — Encounter: Payer: Self-pay | Admitting: Adult Health

## 2016-05-02 VITALS — BP 132/80 | Temp 98.6°F | Ht 69.0 in | Wt 169.5 lb

## 2016-05-02 DIAGNOSIS — M545 Low back pain, unspecified: Secondary | ICD-10-CM

## 2016-05-02 DIAGNOSIS — Z720 Tobacco use: Secondary | ICD-10-CM

## 2016-05-02 DIAGNOSIS — Z7689 Persons encountering health services in other specified circumstances: Secondary | ICD-10-CM

## 2016-05-02 MED ORDER — METHYLPREDNISOLONE ACETATE 80 MG/ML IJ SUSP
80.0000 mg | Freq: Once | INTRAMUSCULAR | Status: AC
  Administered 2016-05-02: 80 mg via INTRAMUSCULAR

## 2016-05-02 MED ORDER — KETOROLAC TROMETHAMINE 60 MG/2ML IM SOLN
60.0000 mg | Freq: Once | INTRAMUSCULAR | Status: AC
  Administered 2016-05-02: 60 mg via INTRAMUSCULAR

## 2016-05-02 NOTE — Patient Instructions (Signed)
It was great meeting you today!  Please follow up with me for your physical. If you need anything in the meantime, please let me know.    

## 2016-05-02 NOTE — Progress Notes (Signed)
Patient presents to clinic today to establish care. He is a pleasant 41 year old male who  has a past medical history of Back pain and Pulmonary embolism (HCC) (2006).   Acute Concerns: Establish Care   Chronic Issues: Tobacco Use - Smokes about one pack a week. The amount depends on his stress level.   Chronic low back pain - this has been present for many years. Appears to be a result of heavy lifting at work. He takes various pain medications as needed.   Health Maintenance: Dental -- Does not do routine care Vision -- Does not do routine care  Immunizations -- UTD  Colonoscopy -- Never had  Diet: Does not follow specific  Exercise: Does heavy lifting at work    Past Medical History:  Diagnosis Date  . Back pain   . Pulmonary embolism (HCC) 2006    History reviewed. No pertinent surgical history.  Current Outpatient Prescriptions on File Prior to Visit  Medication Sig Dispense Refill  . cetirizine (ZYRTEC) 10 MG tablet Take 1 tablet (10 mg total) by mouth 2 (two) times daily. (Patient not taking: Reported on 05/02/2016) 30 tablet 0  . cyclobenzaprine (FLEXERIL) 5 MG tablet 1 pill by mouth up to every 8 hours as needed. Start with one pill by mouth each bedtime as needed due to sedation (Patient not taking: Reported on 05/02/2016) 15 tablet 0  . HYDROcodone-acetaminophen (NORCO) 5-325 MG per tablet Take 1 tablet by mouth every 6 (six) hours as needed. (Patient not taking: Reported on 05/02/2016) 15 tablet 0  . meloxicam (MOBIC) 7.5 MG tablet Take 1-2 tablets (7.5-15 mg total) by mouth daily. (Patient not taking: Reported on 05/02/2016) 30 tablet 0  . naproxen (NAPROSYN) 500 MG tablet Take 1 tablet (500 mg total) by mouth 2 (two) times daily with a meal. (Patient not taking: Reported on 05/02/2016) 30 tablet 0  . traMADol (ULTRAM) 50 MG tablet Take 1 tablet (50 mg total) by mouth every 8 (eight) hours as needed. (Patient not taking: Reported on 05/02/2016) 15 tablet 0   No  current facility-administered medications on file prior to visit.     No Known Allergies  Family History  Problem Relation Age of Onset  . Hypertension Paternal Grandmother   . Hypertension Mother   . Diabetes Maternal Uncle   . Diabetes Paternal Uncle     Social History   Social History  . Marital status: Single    Spouse name: N/A  . Number of children: N/A  . Years of education: N/A   Occupational History  . Not on file.   Social History Main Topics  . Smoking status: Current Some Day Smoker    Packs/day: 0.20    Years: 3.00    Types: Cigarettes  . Smokeless tobacco: Never Used  . Alcohol use 0.0 oz/week     Comment: occasional  . Drug use: No  . Sexual activity: No   Other Topics Concern  . Not on file   Social History Narrative   Married    Two children        Review of Systems  Constitutional: Negative.   HENT: Negative.   Eyes: Negative.   Respiratory: Negative.   Cardiovascular: Negative.   Gastrointestinal: Negative.   Genitourinary: Negative.   Musculoskeletal: Positive for back pain.  Skin: Negative.   Neurological: Negative.   Endo/Heme/Allergies: Negative.   Psychiatric/Behavioral: Negative.     BP 132/80 (BP Location: Left Arm, Patient Position: Sitting, Cuff  Size: Normal)   Temp 98.6 F (37 C) (Oral)   Ht  (1.753 m)   Wt 169 lb 8 oz (76.9 kg)   BMI 25.03 kg/m   Physical Exam  Constitutional: He is oriented to person, place, and time and well-developed, well-nourished, and in no distress. No distress.  HENT:  Head: Normocephalic and atraumatic.  Right Ear: External ear normal.  Left Ear: External ear normal.  Nose: Nose normal.  Mouth/Throat: Oropharynx is clear and moist. No oropharyngeal exudate.  Eyes: Conjunctivae and EOM are normal. Pupils are equal, round, and reactive to light. Right eye exhibits no discharge. Left eye exhibits no discharge. No scleral icterus.  Neck: Normal range of motion. Neck supple. No JVD  present. No tracheal deviation present. No thyromegaly present.  Cardiovascular: Normal rate, regular rhythm, normal heart sounds and intact distal pulses.  Exam reveals no gallop and no friction rub.   No murmur heard. Pulmonary/Chest: Effort normal and breath sounds normal. No stridor. No respiratory distress. He has no wheezes. He has no rales. He exhibits no tenderness.  Abdominal: Soft. Bowel sounds are normal. He exhibits no distension and no mass. There is no tenderness. There is no rebound and no guarding.  Musculoskeletal: Normal range of motion. He exhibits tenderness. He exhibits no edema or deformity.  Muscle spasm on right lower back. No spinal tenderness  Lymphadenopathy:    He has no cervical adenopathy.  Neurological: He is alert and oriented to person, place, and time. He displays normal reflexes. No cranial nerve deficit. He exhibits normal muscle tone. Gait normal. Coordination normal. GCS score is 15.  Skin: Skin is warm and dry. No rash noted. He is not diaphoretic. No erythema. No pallor.  Psychiatric: Mood, memory, affect and judgment normal.  Nursing note and vitals reviewed.  Assessment/Plan: 1. Encounter to establish care - Follow up for CPE or sooner if needed - Work on a heart healthy diet and regular exercise   2. Acute right-sided low back pain without sciatica - ketorolac (TORADOL) injection 60 mg; Inject 2 mLs (60 mg total) into the muscle once. - methylPREDNISolone acetate (DEPO-MEDROL) injection 80 mg; Inject 1 mL (80 mg total) into the muscle once. - Advised on stretching exercises 3. Tobacco use - Does not want to quit at this time.  - Advised to follow up when he does   Shirline Frees, NP

## 2016-05-13 ENCOUNTER — Other Ambulatory Visit: Payer: Self-pay | Admitting: Adult Health

## 2016-05-15 ENCOUNTER — Encounter: Payer: Self-pay | Admitting: Family Medicine

## 2016-08-28 ENCOUNTER — Other Ambulatory Visit: Payer: Self-pay | Admitting: Adult Health

## 2016-08-28 ENCOUNTER — Ambulatory Visit (INDEPENDENT_AMBULATORY_CARE_PROVIDER_SITE_OTHER): Payer: 59 | Admitting: Adult Health

## 2016-08-28 ENCOUNTER — Encounter: Payer: Self-pay | Admitting: Adult Health

## 2016-08-28 VITALS — BP 122/88 | Temp 98.2°F | Wt 162.0 lb

## 2016-08-28 DIAGNOSIS — R300 Dysuria: Secondary | ICD-10-CM

## 2016-08-28 DIAGNOSIS — H1033 Unspecified acute conjunctivitis, bilateral: Secondary | ICD-10-CM

## 2016-08-28 DIAGNOSIS — Z711 Person with feared health complaint in whom no diagnosis is made: Secondary | ICD-10-CM | POA: Diagnosis not present

## 2016-08-28 LAB — URINALYSIS, ROUTINE W REFLEX MICROSCOPIC
Ketones, ur: NEGATIVE
Leukocytes, UA: NEGATIVE
NITRITE: POSITIVE — AB
SPECIFIC GRAVITY, URINE: 1.025 (ref 1.000–1.030)
Total Protein, Urine: NEGATIVE
Urine Glucose: NEGATIVE
Urobilinogen, UA: 0.2 (ref 0.0–1.0)
pH: 6 (ref 5.0–8.0)

## 2016-08-28 MED ORDER — ERYTHROMYCIN 5 MG/GM OP OINT: TOPICAL_OINTMENT | OPHTHALMIC | 0 refills | Status: DC

## 2016-08-28 MED ORDER — CEFTRIAXONE SODIUM 1 G IJ SOLR
1.0000 g | Freq: Once | INTRAMUSCULAR | Status: AC
  Administered 2016-08-28: 1 g via INTRAMUSCULAR

## 2016-08-28 MED ORDER — AZITHROMYCIN 250 MG PO TABS: ORAL_TABLET | ORAL | 0 refills | Status: DC

## 2016-08-28 NOTE — Progress Notes (Signed)
Subjective:    Patient ID: David Barker, male    DOB: Oct 20, 1975, 41 y.o.   MRN: [id number]  HPI  41 year old male who  has a past medical history of Back pain and Pulmonary embolism (HCC) (2006). He presents to the office today with multiple complaints.   1. Conjunctivitis - reports that his symptoms started about 3-4 days ago. His symptoms include that of bilateral redness, pain, itching, discharge and waking up with his eye matted shut. He denies any family members or close friends with pink eye.   2. Concern for STD - he reports that over the last week he has reported painful urination and an episode of hematuria. He does report unsafe sex about one week ago with multiple partners. He denies any drainage from the urethra or pain/swelling in his testicles.   Patient reveals that during exam that he performed oral sex on his multiple sexual partners   Review of Systems See HPI   Past Medical History:  Diagnosis Date  . Back pain   . Pulmonary embolism (HCC) 2006    Social History   Social History  . Marital status: Single    Spouse name: N/A  . Number of children: N/A  . Years of education: N/A   Occupational History  . Not on file.   Social History Main Topics  . Smoking status: Current Some Day Smoker    Packs/day: 0.20    Years: 3.00    Types: Cigarettes  . Smokeless tobacco: Never Used  . Alcohol use 0.0 oz/week     Comment: occasional  . Drug use: No  . Sexual activity: No   Other Topics Concern  . Not on file   Social History Narrative   Married    Two children        No past surgical history on file.  Family History  Problem Relation Age of Onset  . Hypertension Paternal Grandmother   . Hypertension Mother   . Diabetes Maternal Uncle   . Diabetes Paternal Uncle     No Known Allergies  Current Outpatient Prescriptions on File Prior to Visit  Medication Sig Dispense Refill  . cetirizine (ZYRTEC) 10 MG tablet Take 1 tablet (10 mg total) by  mouth 2 (two) times daily. (Patient not taking: Reported on 05/02/2016) 30 tablet 0  . cyclobenzaprine (FLEXERIL) 5 MG tablet 1 pill by mouth up to every 8 hours as needed. Start with one pill by mouth each bedtime as needed due to sedation (Patient not taking: Reported on 05/02/2016) 15 tablet 0  . HYDROcodone-acetaminophen (NORCO) 5-325 MG per tablet Take 1 tablet by mouth every 6 (six) hours as needed. (Patient not taking: Reported on 05/02/2016) 15 tablet 0  . meloxicam (MOBIC) 7.5 MG tablet Take 1-2 tablets (7.5-15 mg total) by mouth daily. (Patient not taking: Reported on 05/02/2016) 30 tablet 0  . naproxen (NAPROSYN) 500 MG tablet Take 1 tablet (500 mg total) by mouth 2 (two) times daily with a meal. (Patient not taking: Reported on 05/02/2016) 30 tablet 0  . traMADol (ULTRAM) 50 MG tablet Take 1 tablet (50 mg total) by mouth every 8 (eight) hours as needed. (Patient not taking: Reported on 05/02/2016) 15 tablet 0   No current facility-administered medications on file prior to visit.     BP 122/88   Temp 98.2 F (36.8 C) (Oral)   Wt 162 lb (73.5 kg)   BMI 23.92 kg/m       Objective:  Physical Exam  Constitutional: He is oriented to person, place, and time. He appears well-developed and well-nourished. No distress.  Eyes: Pupils are equal, round, and reactive to light. EOM are normal. Lids are everted and swept, no foreign bodies found. Right eye exhibits discharge. Left eye exhibits discharge. Right conjunctiva is injected. Left conjunctiva is injected.  Cardiovascular: Normal rate, regular rhythm, normal heart sounds and intact distal pulses.  Exam reveals no gallop and no friction rub.   No murmur heard. Pulmonary/Chest: Effort normal and breath sounds normal. No respiratory distress. He has no wheezes. He has no rales. He exhibits no tenderness.  Genitourinary: Testes normal and penis normal. Circumcised. No penile erythema or penile tenderness. No discharge found.  Neurological: He  is alert and oriented to person, place, and time.  Skin: Skin is warm and dry. No rash noted. He is not diaphoretic. No erythema. No pallor.  Psychiatric: He has a normal mood and affect. His behavior is normal. Judgment and thought content normal.  Nursing note and vitals reviewed.     Assessment & Plan:   1. Acute bacterial conjunctivitis of both eyes - Due to concern for STD and his symptoms he is currently having. I am going to treat for possible gonorrheal and/or chlamydia conjunctivitis  - erythromycin (ROMYCIN) ophthalmic ointment; Apply thin ribbon to affected eye(s) four times daily for 7 days.  Dispense: 1 g; Refill: 0 - azithromycin (ZITHROMAX Z-PAK) 250 MG tablet; Take 4 tablets at the same time  Dispense: 4 tablet; Refill: 0 - cefTRIAXone (ROCEPHIN) injection 1 g; Inject 1 g into the muscle once.  2. Concern about STD in male without diagnosis  - HIV antibody - Urine cytology ancillary only - RPR - azithromycin (ZITHROMAX Z-PAK) 250 MG tablet; Take 4 tablets at the same time  Dispense: 4 tablet; Refill: 0 - cefTRIAXone (ROCEPHIN) injection 1 g; Inject 1 g into the muscle once. - Urinalysis  Shirline Freesory Jashua Knaak, NP

## 2016-08-28 NOTE — Patient Instructions (Signed)
It was great seeing you today   I have sent in Erythromycin ointment for pink eye. Apply a thin ribbon four times per day in each eye for 7 days   I will follow up with you about your lab work

## 2016-08-29 ENCOUNTER — Encounter: Payer: Self-pay | Admitting: Family Medicine

## 2016-08-29 ENCOUNTER — Telehealth: Payer: Self-pay | Admitting: Adult Health

## 2016-08-29 LAB — HIV ANTIBODY (ROUTINE TESTING W REFLEX): HIV: NONREACTIVE

## 2016-08-29 LAB — RPR

## 2016-08-29 NOTE — Telephone Encounter (Signed)
Per Tandy Gawory, Ok to give letter.  Pt notified to pick up at the front desk.

## 2016-08-29 NOTE — Telephone Encounter (Signed)
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  If it is not okay to go back to work he would like to have a doctors note to excuse him for the time period he will be out.

## 2016-08-30 LAB — URINE CULTURE: Organism ID, Bacteria: NO GROWTH

## 2016-10-06 ENCOUNTER — Telehealth: Payer: Self-pay | Admitting: Family Medicine

## 2016-10-06 NOTE — Telephone Encounter (Signed)
Received FMLA form.  Pt last seen on 08/28/16.  Left a message for the pt to return my call.  Need to see if form is related to last visit.

## 2016-10-07 NOTE — Telephone Encounter (Signed)
Spoke to the pt.  FMLA form is in relation to the office visit on 08/28/16

## 2016-10-08 NOTE — Telephone Encounter (Signed)
Form faxed

## 2017-01-09 ENCOUNTER — Ambulatory Visit: Payer: Self-pay

## 2017-01-09 ENCOUNTER — Other Ambulatory Visit: Payer: Self-pay | Admitting: Occupational Medicine

## 2017-01-09 DIAGNOSIS — Z0000 Encounter for general adult medical examination without abnormal findings: Secondary | ICD-10-CM

## 2017-07-02 ENCOUNTER — Encounter: Payer: Self-pay | Admitting: Adult Health

## 2017-07-02 ENCOUNTER — Ambulatory Visit (INDEPENDENT_AMBULATORY_CARE_PROVIDER_SITE_OTHER): Payer: 59 | Admitting: Adult Health

## 2017-07-02 VITALS — BP 124/82 | Temp 98.8°F | Wt 172.0 lb

## 2017-07-02 DIAGNOSIS — M5441 Lumbago with sciatica, right side: Secondary | ICD-10-CM | POA: Diagnosis not present

## 2017-07-02 DIAGNOSIS — M25462 Effusion, left knee: Secondary | ICD-10-CM | POA: Diagnosis not present

## 2017-07-02 MED ORDER — CYCLOBENZAPRINE HCL 10 MG PO TABS: 10.0000 mg | ORAL_TABLET | Freq: Every day | ORAL | 0 refills | Status: AC

## 2017-07-02 MED ORDER — METHYLPREDNISOLONE 4 MG PO TBPK: ORAL_TABLET | ORAL | 0 refills | Status: DC

## 2017-07-02 NOTE — Progress Notes (Signed)
Subjective:    Patient ID: David Barker, male    DOB: 12/27/1975, 42 y.o.   MRN: [id number]  HPI 42 year old male who  has a past medical history of Back pain and Pulmonary embolism (HCC) (2006). Presents to the office today for an acute complaint of back pain and left knee swelling. Report that his symptoms have been present for 2 weeks. He has a very physically demanding job with [employer].   Back pain - right lower side with radiation down leg. Pain is described as sharp and burning. He has been using various conservative therapies such as heat/ice/ and motrin - nothing has helped with his pain. Denies any trauma but does lift heavy objects at work   Left knee swelling - reports no pain but feels as though his knee has become swollen over the last two weeks. Has no issues with walking and his knee does not feel unstable. He denies any trauma to the left knee. He has not noticed any redness or warmth     Review of Systems See HPI   Past Medical History:  Diagnosis Date  . Back pain   . Pulmonary embolism (HCC) 2006    Social History   Socioeconomic History  . Marital status: Single    Spouse name: Not on file  . Number of children: Not on file  . Years of education: Not on file  . Highest education level: Not on file  Occupational History  . Not on file  Social Needs  . Financial resource strain: Not on file  . Food insecurity:    Worry: Not on file    Inability: Not on file  . Transportation needs:    Medical: Not on file    Non-medical: Not on file  Tobacco Use  . Smoking status: Current Some Day Smoker    Packs/day: 0.20    Years: 3.00    Pack years: 0.60    Types: Cigarettes  . Smokeless tobacco: Never Used  Substance and Sexual Activity  . Alcohol use: Yes    Alcohol/week: 0.0 oz    Comment: occasional  . Drug use: No  . Sexual activity: Never    Birth control/protection: Abstinence  Lifestyle  . Physical activity:    Days per week: Not on file   Minutes per session: Not on file  . Stress: Not on file  Relationships  . Social connections:    Talks on phone: Not on file    Gets together: Not on file    Attends religious service: Not on file    Active member of club or organization: Not on file    Attends meetings of clubs or organizations: Not on file    Relationship status: Not on file  . Intimate partner violence:    Fear of current or ex partner: Not on file    Emotionally abused: Not on file    Physically abused: Not on file    Forced sexual activity: Not on file  Other Topics Concern  . Not on file  Social History Narrative   Married    Two children     History reviewed. No pertinent surgical history.  Family History  Problem Relation Age of Onset  . Hypertension Paternal Grandmother   . Hypertension Mother   . Diabetes Maternal Uncle   . Diabetes Paternal Uncle     No Known Allergies  No current outpatient medications on file prior to visit.   No current facility-administered  medications on file prior to visit.     BP 124/82   Temp 98.8 F (37.1 C) (Oral)   Wt 172 lb (78 kg)   BMI 25.40 kg/m       Objective:   Physical Exam  Constitutional: He is oriented to person, place, and time. He appears well-developed and well-nourished. No distress.  Cardiovascular: Normal rate, regular rhythm, normal heart sounds and intact distal pulses. Exam reveals no gallop and no friction rub.  No murmur heard. Pulmonary/Chest: Effort normal and breath sounds normal.  Musculoskeletal: Normal range of motion. He exhibits tenderness (right lower back and along sciatic nerve. No spinal tenderness). He exhibits no edema or deformity.       Left knee: Normal. He exhibits normal range of motion, no swelling, no effusion, no ecchymosis, normal alignment, no LCL laxity, normal patellar mobility and no bony tenderness. No tenderness found. No medial joint line, no lateral joint line, no MCL, no LCL and no patellar tendon  tenderness noted.  Neurological: He is alert and oriented to person, place, and time.  Skin: Skin is warm and dry. Capillary refill takes less than 2 seconds. He is not diaphoretic.  Psychiatric: He has a normal mood and affect. His behavior is normal. Judgment and thought content normal.  Nursing note and vitals reviewed.     Assessment & Plan:  1. Acute right-sided low back pain with right-sided sciatica - exam consistent with sciatica. Advised warm compress and motrin with rest.  - cyclobenzaprine (FLEXERIL) 10 MG tablet; Take 1 tablet (10 mg total) by mouth at bedtime for 15 days.  Dispense: 15 tablet; Refill: 0 - methylPREDNISolone (MEDROL DOSEPAK) 4 MG TBPK tablet; Take as directed  Dispense: 21 tablet; Refill: 0 - Follow up if not resolved  2. Swelling of joint of left knee - Completely normal knee - No swelling noted.  - No knee instability  - Normal ROM    Shirline Frees, NP

## 2017-07-09 ENCOUNTER — Encounter: Payer: Self-pay | Admitting: Adult Health

## 2017-07-09 ENCOUNTER — Ambulatory Visit (INDEPENDENT_AMBULATORY_CARE_PROVIDER_SITE_OTHER): Payer: 59 | Admitting: Adult Health

## 2017-07-09 ENCOUNTER — Other Ambulatory Visit (HOSPITAL_COMMUNITY)
Admission: RE | Admit: 2017-07-09 | Discharge: 2017-07-09 | Disposition: A | Discharge status: Home / Self Care | Payer: 59 | Source: Ambulatory Visit | Attending: Adult Health | Admitting: Adult Health

## 2017-07-09 VITALS — BP 120/82 | Temp 98.5°F | Ht 68.5 in | Wt 171.0 lb

## 2017-07-09 DIAGNOSIS — Z0000 Encounter for general adult medical examination without abnormal findings: Secondary | ICD-10-CM

## 2017-07-09 DIAGNOSIS — Z113 Encounter for screening for infections with a predominantly sexual mode of transmission: Secondary | ICD-10-CM

## 2017-07-09 DIAGNOSIS — F1010 Alcohol abuse, uncomplicated: Secondary | ICD-10-CM | POA: Diagnosis not present

## 2017-07-09 DIAGNOSIS — Z720 Tobacco use: Secondary | ICD-10-CM | POA: Diagnosis not present

## 2017-07-09 LAB — CBC WITH DIFFERENTIAL/PLATELET
BASOS PCT: 0.6 % (ref 0.0–3.0)
Basophils Absolute: 0 10*3/uL (ref 0.0–0.1)
EOS PCT: 1.4 % (ref 0.0–5.0)
Eosinophils Absolute: 0.1 10*3/uL (ref 0.0–0.7)
HEMATOCRIT: 44.3 % (ref 39.0–52.0)
Hemoglobin: 15.2 g/dL (ref 13.0–17.0)
LYMPHS ABS: 2.8 10*3/uL (ref 0.7–4.0)
LYMPHS PCT: 35.6 % (ref 12.0–46.0)
MCHC: 34.4 g/dL (ref 30.0–36.0)
MCV: 95.5 fl (ref 78.0–100.0)
MONOS PCT: 7 % (ref 3.0–12.0)
Monocytes Absolute: 0.5 10*3/uL (ref 0.1–1.0)
NEUTROS ABS: 4.3 10*3/uL (ref 1.4–7.7)
NEUTROS PCT: 55.4 % (ref 43.0–77.0)
PLATELETS: 285 10*3/uL (ref 150.0–400.0)
RBC: 4.64 Mil/uL (ref 4.22–5.81)
RDW: 14.1 % (ref 11.5–15.5)
WBC: 7.8 10*3/uL (ref 4.0–10.5)

## 2017-07-09 LAB — HEPATIC FUNCTION PANEL
ALBUMIN: 4.5 g/dL (ref 3.5–5.2)
ALT: 36 U/L (ref 0–53)
AST: 31 U/L (ref 0–37)
Alkaline Phosphatase: 78 U/L (ref 39–117)
Bilirubin, Direct: 0.1 mg/dL (ref 0.0–0.3)
TOTAL PROTEIN: 7.1 g/dL (ref 6.0–8.3)
Total Bilirubin: 0.6 mg/dL (ref 0.2–1.2)

## 2017-07-09 LAB — LIPID PANEL
CHOL/HDL RATIO: 3
Cholesterol: 216 mg/dL — ABNORMAL HIGH (ref 0–200)
HDL: 66.8 mg/dL (ref >39.00)
LDL Cholesterol: 115 mg/dL — ABNORMAL HIGH (ref 0–99)
NONHDL: 149.29
TRIGLYCERIDES: 173 mg/dL — AB (ref 0.0–149.0)
VLDL: 34.6 mg/dL (ref 0.0–40.0)

## 2017-07-09 LAB — BASIC METABOLIC PANEL
BUN: 13 mg/dL (ref 6–23)
CALCIUM: 9.5 mg/dL (ref 8.4–10.5)
CHLORIDE: 103 meq/L (ref 96–112)
CO2: 29 meq/L (ref 19–32)
Creatinine, Ser: 0.87 mg/dL (ref 0.40–1.50)
GFR: 124.49 mL/min (ref >60.00)
Glucose, Bld: 84 mg/dL (ref 70–99)
Potassium: 4.5 mEq/L (ref 3.5–5.1)
SODIUM: 139 meq/L (ref 135–145)

## 2017-07-09 LAB — TSH: TSH: 1.21 u[IU]/mL (ref 0.35–4.50)

## 2017-07-09 LAB — HEMOGLOBIN A1C: Hgb A1c MFr Bld: 5.7 % (ref 4.6–6.5)

## 2017-07-09 MED ORDER — VARENICLINE TARTRATE 0.5 MG X 11 & 1 MG X 42 PO MISC: ORAL | 0 refills | Status: DC

## 2017-07-09 NOTE — Progress Notes (Signed)
Subjective:    Patient ID: David Barker, male    DOB: 1975/08/06, 42 y.o.   MRN: [id number]  HPI Patient presents for yearly preventative medicine examination. He is a pleasant 42 year old male who  has a past medical history of Back pain and Pulmonary embolism (HCC) (2006).   Tobacco Use - he would like to quit 0.5 - 1 pack per day. He has never tried anything in the past. He denies any SOB or chest pain. He would like to quit smokig   Alcohol Use - Consumption depends on the environment he is in. He will often drink beer and liquor together. He has had one DUI in the past. He wants to quit drinking and has started cutting back on alcohol   All immunizations and health maintenance protocols were reviewed with the patient and needed orders were placed.  Appropriate screening laboratory values were ordered for the patient including screening of hyperlipidemia, renal function and hepatic function.  Medication reconciliation,  past medical history, social history, problem list and allergies were reviewed in detail with the patient. He does not exercise nor eat healthy   Goals were established with regard to weight loss, exercise, and  diet in compliance with medications   Review of Systems  Constitutional: Negative.   HENT: Negative.   Eyes: Negative.   Respiratory: Negative.   Cardiovascular: Negative.   Gastrointestinal: Negative.   Endocrine: Negative.   Genitourinary: Negative.   Musculoskeletal: Negative.   Skin: Negative.   Allergic/Immunologic: Negative.   Neurological: Negative.   Hematological: Negative.   Psychiatric/Behavioral: Negative.   All other systems reviewed and are negative.  Past Medical History:  Diagnosis Date  . Back pain   . Pulmonary embolism (HCC) 2006    Social History   Socioeconomic History  . Marital status: Single    Spouse name: Not on file  . Number of children: Not on file  . Years of education: Not on file  . Highest education level:  Not on file  Occupational History  . Not on file  Social Needs  . Financial resource strain: Not on file  . Food insecurity:    Worry: Not on file    Inability: Not on file  . Transportation needs:    Medical: Not on file    Non-medical: Not on file  Tobacco Use  . Smoking status: Current Some Day Smoker    Packs/day: 0.20    Years: 3.00    Pack years: 0.60    Types: Cigarettes  . Smokeless tobacco: Never Used  Substance and Sexual Activity  . Alcohol use: Yes    Alcohol/week: 0.0 oz    Comment: occasional  . Drug use: No  . Sexual activity: Never    Birth control/protection: Abstinence  Lifestyle  . Physical activity:    Days per week: Not on file    Minutes per session: Not on file  . Stress: Not on file  Relationships  . Social connections:    Talks on phone: Not on file    Gets together: Not on file    Attends religious service: Not on file    Active member of club or organization: Not on file    Attends meetings of clubs or organizations: Not on file    Relationship status: Not on file  . Intimate partner violence:    Fear of current or ex partner: Not on file    Emotionally abused: Not on file    Physically  abused: Not on file    Forced sexual activity: Not on file  Other Topics Concern  . Not on file  Social History Narrative   Married    Two children     History reviewed. No pertinent surgical history.  Family History  Problem Relation Age of Onset  . Hypertension Paternal Grandmother   . Hypertension Mother   . Diabetes Maternal Uncle   . Diabetes Paternal Uncle     No Known Allergies  Current Outpatient Medications on File Prior to Visit  Medication Sig Dispense Refill  . cyclobenzaprine (FLEXERIL) 10 MG tablet Take 1 tablet (10 mg total) by mouth at bedtime for 15 days. 15 tablet 0   No current facility-administered medications on file prior to visit.     BP 120/82   Temp 98.5 F (36.9 C) (Oral)   Ht 5' 8.5" (1.74 m)   Wt 171 lb  (77.6 kg)   BMI 25.62 kg/m       Objective:   Physical Exam  Constitutional: He is oriented to person, place, and time. He appears well-developed and well-nourished. No distress.  HENT:  Head: Normocephalic and atraumatic.  Right Ear: External ear normal.  Left Ear: External ear normal.  Nose: Nose normal.  Mouth/Throat: Oropharynx is clear and moist. No oropharyngeal exudate.  Eyes: Pupils are equal, round, and reactive to light. Conjunctivae and EOM are normal. Right eye exhibits no discharge. Left eye exhibits no discharge. No scleral icterus.  Neck: Normal range of motion. Neck supple. No JVD present. No tracheal deviation present. No thyromegaly present.  Cardiovascular: Normal rate, regular rhythm, normal heart sounds and intact distal pulses. Exam reveals no gallop and no friction rub.  No murmur heard. Pulmonary/Chest: Effort normal and breath sounds normal. No stridor. No respiratory distress. He has no wheezes. He has no rales. He exhibits no tenderness.  Abdominal: Soft. Bowel sounds are normal. He exhibits no distension and no mass. There is no tenderness. There is no rebound and no guarding. No hernia.  Musculoskeletal: Normal range of motion. He exhibits no edema, tenderness or deformity.  Lymphadenopathy:    He has no cervical adenopathy.  Neurological: He is alert and oriented to person, place, and time. He displays normal reflexes. No cranial nerve deficit or sensory deficit. He exhibits normal muscle tone. Coordination normal.  Skin: Skin is warm and dry. Capillary refill takes less than 2 seconds. No rash noted. He is not diaphoretic. No erythema. No pallor.  Psychiatric: He has a normal mood and affect. His behavior is normal. Judgment and thought content normal.  Nursing note and vitals reviewed.     Assessment & Plan:  1. Routine general medical examination at a health care facility - Needs to start exercising and eating healthy  - Follow up in one year or  sooner if needed - Basic metabolic panel - CBC with Differential/Platelet - Hemoglobin A1c - Hepatic function panel - Lipid panel - TSH  2. Alcohol abuse - Advised to start cutting back on the amount he drinks  - We will start on quitting smoking then move on to quitting   3. Screen for STD (sexually transmitted disease)  - HIV antibody - Urine cytology ancillary only  4. Tobacco use Trial of chantix. Common side effects including rare risk of suicide ideation was discussed with the patient today.  Patient is instructed to go directly to the ED if this occurs.  We discussed that patient can continue to smoke for 1 week  after starting chantix, but then must discontinue cigarettes.  He is also instructed to contact us prior to completion of the starter month pack for an rx for the continuation month pack.  5 minutes spent with patient today on tobacco cessation counseling.   - varenicline (CHANTIX STARTING MONTH PAK) 0.5 MG X 11 & 1 MG X 42 tablet; Take one 0.5 mg tablet by mouth once daily for 3 days, then increase to one 0.5 mg tablet twice daily for 4 days, then increase to one 1 mg tablet twice daily.  Dispense: 53 tablet; Refill: 0 - DG Chest 2 View; Future  Shirline Freesory Torres [place], NP

## 2017-07-09 NOTE — Addendum Note (Signed)
Addended by: Nancy FetterNAFZIGER, Kashari Chalmers L on: 07/09/2017 02:28 PM   Modules accepted: Level of Service

## 2017-07-10 LAB — URINE CYTOLOGY ANCILLARY ONLY
CHLAMYDIA, DNA PROBE: NEGATIVE
Neisseria Gonorrhea: NEGATIVE
TRICH (WINDOWPATH): NEGATIVE

## 2017-07-10 LAB — HIV ANTIBODY (ROUTINE TESTING W REFLEX): HIV 1&2 Ab, 4th Generation: NONREACTIVE

## 2017-08-19 ENCOUNTER — Telehealth: Payer: Self-pay | Admitting: Family Medicine

## 2017-08-19 NOTE — Telephone Encounter (Signed)
Copied from CRM [id number]. Topic: General - Other >> Aug 19, 2017  2:43 PM Gerrianne ScalePayne, Angela L wrote: Reason for CRM: pt calling wanting a copy of his AVS or office notes so that he can send it to his insurance so that they can have a DX code for the visit     I left a voice message, a copy of notes are ready for pick up here at front office.

## 2017-08-19 NOTE — Telephone Encounter (Signed)
AVS placed upfront

## 2017-08-19 NOTE — Patient Instructions (Addendum)
It was great seeing you today!  Please work on cutting back on alcohol consumption   I have sent in a prescription for Chantix to help you quit smoking. Follow up with me just prior to completing the starting pack. If you develop suicidal thoughts or bad dreams, please discontinue medication right away and follow up with me   We will follow up with you regarding your labs   Start working on life style modifications such as routine exercise and heart healthy diet

## 2017-10-22 ENCOUNTER — Encounter: Payer: Self-pay | Admitting: Adult Health

## 2017-10-22 ENCOUNTER — Ambulatory Visit (INDEPENDENT_AMBULATORY_CARE_PROVIDER_SITE_OTHER): Payer: 59

## 2017-10-22 ENCOUNTER — Ambulatory Visit (INDEPENDENT_AMBULATORY_CARE_PROVIDER_SITE_OTHER): Payer: 59 | Admitting: Adult Health

## 2017-10-22 VITALS — BP 110/70 | HR 58 | Temp 98.3°F | Wt 174.2 lb

## 2017-10-22 DIAGNOSIS — M545 Low back pain, unspecified: Secondary | ICD-10-CM

## 2017-10-22 DIAGNOSIS — G8929 Other chronic pain: Secondary | ICD-10-CM

## 2017-10-22 MED ORDER — CYCLOBENZAPRINE HCL 10 MG PO TABS: 10.0000 mg | ORAL_TABLET | Freq: Every day | ORAL | 0 refills | Status: DC

## 2017-10-22 NOTE — Progress Notes (Signed)
Subjective:    Patient ID: David Barker, male    DOB: 1975-05-03, 42 y.o.   MRN: [id number]  HPI 42 year old male who  has a past medical history of Back pain and Pulmonary embolism (HCC) (2006). He presents to the office today for a chronic issue of low back pain. His pain has been intermittent since the last time I saw him in June 2019. Over the last three weeks his pain has been becoming worse. He had to go to Urgent Care 4 days ago due to severe pain and when getting out of bed. Report pain is on the right lower back and radiates into the groin. Pain is worse with changing positions, twisting and turning, as well as lifting heavy objects.   Review of Systems See HPI   Past Medical History:  Diagnosis Date  . Back pain   . Pulmonary embolism (HCC) 2006    Social History   Socioeconomic History  . Marital status: Single    Spouse name: Not on file  . Number of children: Not on file  . Years of education: Not on file  . Highest education level: Not on file  Occupational History  . Not on file  Social Needs  . Financial resource strain: Not on file  . Food insecurity:    Worry: Not on file    Inability: Not on file  . Transportation needs:    Medical: Not on file    Non-medical: Not on file  Tobacco Use  . Smoking status: Current Some Day Smoker    Packs/day: 0.20    Years: 3.00    Pack years: 0.60    Types: Cigarettes  . Smokeless tobacco: Never Used  Substance and Sexual Activity  . Alcohol use: Yes    Alcohol/week: 0.0 standard drinks    Comment: occasional  . Drug use: No  . Sexual activity: Never    Birth control/protection: Abstinence  Lifestyle  . Physical activity:    Days per week: Not on file    Minutes per session: Not on file  . Stress: Not on file  Relationships  . Social connections:    Talks on phone: Not on file    Gets together: Not on file    Attends religious service: Not on file    Active member of club or organization: Not on file   Attends meetings of clubs or organizations: Not on file    Relationship status: Not on file  . Intimate partner violence:    Fear of current or ex partner: Not on file    Emotionally abused: Not on file    Physically abused: Not on file    Forced sexual activity: Not on file  Other Topics Concern  . Not on file  Social History Narrative   Married    Two children     No past surgical history on file.  Family History  Problem Relation Age of Onset  . Hypertension Paternal Grandmother   . Hypertension Mother   . Diabetes Maternal Uncle   . Diabetes Paternal Uncle     No Known Allergies  Current Outpatient Medications on File Prior to Visit  Medication Sig Dispense Refill  . diclofenac (VOLTAREN) 75 MG EC tablet     . varenicline (CHANTIX STARTING MONTH PAK) 0.5 MG X 11 & 1 MG X 42 tablet Take one 0.5 mg tablet by mouth once daily for 3 days, then increase to one 0.5 mg tablet twice daily  for 4 days, then increase to one 1 mg tablet twice daily. 53 tablet 0   No current facility-administered medications on file prior to visit.     BP 110/70 (BP Location: Left Arm, Patient Position: Sitting, Cuff Size: Normal)   Pulse (!) 58   Temp 98.3 F (36.8 C) (Oral)   Wt 174 lb 3.2 oz (79 kg)   SpO2 99%   BMI 26.10 kg/m       Objective:   Physical Exam  Constitutional: He is oriented to person, place, and time. He appears well-developed and well-nourished. No distress.  Cardiovascular: Normal rate, regular rhythm, normal heart sounds and intact distal pulses.  Pulmonary/Chest: Effort normal and breath sounds normal.  Musculoskeletal: He exhibits tenderness (right lower back. No spinal tenderness. No abnormality felt. Pain with flexion, extension, and twisting movements ).  Neurological: He is alert and oriented to person, place, and time.  Skin: Skin is warm and dry. He is not diaphoretic.  Psychiatric: He has a normal mood and affect. His behavior is normal. Judgment and thought  content normal.  Nursing note and vitals reviewed.     Assessment & Plan:  1. Chronic right-sided low back pain without sciatica - Appears more muscular.  - Will get xray and provide muscle relaxer. Consider referral to orthopedics  - DG Lumbar Spine Complete; Future - cyclobenzaprine (FLEXERIL) 10 MG tablet; Take 1 tablet (10 mg total) by mouth at bedtime.  Dispense: 30 tablet; Refill: 0  Shirline Frees, NP

## 2017-10-23 ENCOUNTER — Other Ambulatory Visit: Payer: Self-pay | Admitting: Adult Health

## 2017-10-23 DIAGNOSIS — G8929 Other chronic pain: Secondary | ICD-10-CM

## 2017-10-23 DIAGNOSIS — M545 Low back pain: Secondary | ICD-10-CM

## 2017-10-23 DIAGNOSIS — R937 Abnormal findings on diagnostic imaging of other parts of musculoskeletal system: Secondary | ICD-10-CM

## 2017-10-28 ENCOUNTER — Encounter (INDEPENDENT_AMBULATORY_CARE_PROVIDER_SITE_OTHER): Payer: Self-pay | Admitting: Orthopaedic Surgery

## 2017-10-28 ENCOUNTER — Ambulatory Visit (INDEPENDENT_AMBULATORY_CARE_PROVIDER_SITE_OTHER): Payer: 59 | Admitting: Orthopaedic Surgery

## 2017-10-28 VITALS — BP 134/84 | HR 77 | Ht 69.0 in | Wt 170.0 lb

## 2017-10-28 DIAGNOSIS — G8929 Other chronic pain: Secondary | ICD-10-CM | POA: Diagnosis not present

## 2017-10-28 DIAGNOSIS — M5441 Lumbago with sciatica, right side: Secondary | ICD-10-CM | POA: Diagnosis not present

## 2017-10-28 NOTE — Addendum Note (Signed)
Addended by: Rogers Seeds on: 10/28/2017 09:16 AM   Modules accepted: Orders

## 2017-10-28 NOTE — Progress Notes (Signed)
Consult Note   Patient: David Barker             Date of Birth: 16-Jan-1976           MRN: [id number]             Visit Date: 10/28/2017  Requested by: Shirline Frees, NP [address] [place], [place] [address] PCP: Shirline Frees, NP  Thank you for asking me to evaluate this patient for Pain of the Lower Back  Below are my findings.     Assessment & Plan: Visit Diagnoses:  1. Chronic right-sided low back pain with right-sided sciatica     Plan: Patient's had several month history of back and right leg pain.  He is been through conservative treatment for several months.  I recommend proceeding with an MRI scan he states his pain is severe enough that currently is not been able to work and work note was given no work pending MRI review.  Office follow-up after MRI.  Thank the opportunity to see him in consultation.  Follow Up Instructions: No follow-ups on file.  Orders: No orders of the defined types were placed in this encounter.  No orders of the defined types were placed in this encounter.    Procedures: No procedures performed   Clinical Data: No additional findings.   Subjective:  Chief Complaint  Patient presents with  . 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  Recently is had 3 to 4 months history of significant increased pain.  He had a intramuscular cortisone injection which did help to some degree.  In past years when he had a cortisone injection he got good relief and resolution of his symptoms.  He is been treated with a prednisone Dosepak and had a day or 2 of relief then recurrence of symptoms.  Back pain right buttocks pain radiates into his thigh not all the way down to his foot no associated bowel or bladder symptoms no fever or chills.  No  past history of back injury.  His back symptoms have been intermittent, episodic, currently rated as severe difficulty working but he still working.  No history of falls.  Patient was referred here for orthopedic consultation formal by [facility].  Patient currently on Voltaren he is been on diclofenac, Flexeril in the past, prednisone Dosepak, intramuscular cortisone injection.  Review of Systems 14 point review of systems performed past history of pulmonary embolism 2006 no current dyspnea or chest pain.  He was on anticoagulant for period time no problems in the last 15 years.  Some intermittent problems with his right knee with occasional pain.  No history of rheumatologic conditions.  Positive for smoking history less than fourth pack per day.  Otherwise negative as it pertains HPI.   Objective: Vital Signs: BP 134/84   Pulse 77   Ht 5\' 9"  (1.753 m)   Wt 170 lb (77.1 kg)   BMI 25.10 kg/m   Physical Exam  Constitutional: He is oriented to person, place, and time. He appears well-developed and well-nourished.  HENT:  Head: Normocephalic and atraumatic.  Eyes: Pupils are equal, round, and reactive to light. EOM are normal.  Neck: No tracheal deviation present. No thyromegaly present.  Cardiovascular: Normal rate.  Pulmonary/Chest: Effort normal. He  has no wheezes.  Abdominal: Soft. Bowel sounds are normal.  Neurological: He is alert and oriented to person, place, and time.  Skin: Skin is warm and dry. Capillary refill takes less than 2 seconds.  Psychiatric: He has a normal mood and affect. His behavior is normal. Judgment and thought content normal.     Ortho Exam pelvis is level patient slow getting from sitting standing.  He has discomfort with extension.  Has some pain with straight leg raising 80 degrees on the right negative on the left knee and ankle jerk are intact he is able to heel and toe walk.  Forward flexion is reproduction of back pain right buttocks pain.   Minimal trochanteric bursal tenderness.  Positive sciatic notch tenderness on the right negative on the left.  Anterior tib EHL is strong.  Knee and ankle jerk are 2+ and symmetrical.  Negative logroll to the hips. Specialty Comments:  No specialty comments available.  Imaging:  CLINICAL DATA:  42 year old male with right side low back and groin pain intermittently x4 months.  EXAM: LUMBAR SPINE - COMPLETE 4+ VIEW  COMPARISON:  Lumbar radiographs 09/27/2013.  FINDINGS: Normal lumbar segmentation. Bone mineralization is within normal limits. Chronic mild straightening of lumbar lordosis. Mild disc space loss since 2015 at both L4-L5 and L5-S1. Other disc spaces are preserved. No pars fracture. No acute osseous abnormality identified. Sacral ala and SI joints appear normal. Negative visible bowel gas pattern.  IMPRESSION: 1.  No acute osseous abnormality identified in the lumbar spine. 2. 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   Electronically Signed   By: Odessa Fleming M.D.   On: 10/22/2017 16:48   PMFS History: Patient Active Problem List   Diagnosis Date Noted  . Lip swelling 08/16/2015   Past Medical History:  Diagnosis Date  . Back pain   . Pulmonary embolism (HCC) 2006    Family History  Problem Relation Age of Onset  . Hypertension Paternal Grandmother   . Hypertension Mother   . Diabetes Maternal Uncle   . Diabetes Paternal Uncle    No past surgical history on file. Social History   Occupational History  . Not on file  Tobacco Use  . Smoking status: Current Some Day Smoker    Packs/day: 0.20    Years: 3.00    Pack years: 0.60    Types: Cigarettes  . Smokeless tobacco: Never Used  Substance and Sexual Activity  . Alcohol use: Yes    Alcohol/week: 0.0 standard drinks    Comment: occasional  . Drug use: No  . Sexual activity: Never    Birth control/protection: Abstinence

## 2017-11-02 ENCOUNTER — Ambulatory Visit
Admission: RE | Admit: 2017-11-02 | Discharge: 2017-11-02 | Disposition: A | Discharge status: Home / Self Care | Payer: 59 | Source: Ambulatory Visit | Attending: Orthopaedic Surgery | Admitting: Orthopaedic Surgery

## 2017-11-02 DIAGNOSIS — M5441 Lumbago with sciatica, right side: Principal | ICD-10-CM

## 2017-11-02 DIAGNOSIS — G8929 Other chronic pain: Secondary | ICD-10-CM

## 2017-11-04 ENCOUNTER — Other Ambulatory Visit: Payer: Self-pay

## 2017-11-18 ENCOUNTER — Ambulatory Visit (INDEPENDENT_AMBULATORY_CARE_PROVIDER_SITE_OTHER): Payer: 59 | Admitting: Orthopaedic Surgery

## 2017-11-18 ENCOUNTER — Encounter (INDEPENDENT_AMBULATORY_CARE_PROVIDER_SITE_OTHER): Payer: Self-pay | Admitting: Orthopaedic Surgery

## 2017-11-18 VITALS — BP 137/92 | HR 67 | Ht 69.0 in | Wt 170.0 lb

## 2017-11-18 DIAGNOSIS — M5126 Other intervertebral disc displacement, lumbar region: Secondary | ICD-10-CM | POA: Diagnosis not present

## 2017-11-18 NOTE — Progress Notes (Signed)
Office Visit Note   Patient: David Barker           Date of Birth: 10-05-1975           MRN: [id number] Visit Date: 11/18/2017              Requested by: Shirline Frees, NP [address] [place], [place] [address] PCP: Shirline Frees, NP   Assessment & Plan: Visit Diagnoses:  1. Lumbar disc herniation     Plan: We will set patient up for single epidural.  He has L5-S1 HNP on the right and left paracentral disc protrusion at L4-5. He has extruded disc on the right at L5-S1 consistent with his symptoms.  We will set him up for single epidural at L5-S1 and see him back in 2 weeks.  Work slip given no work x2 weeks. Follow-Up Instructions: Return in about 2 weeks (around 12/02/2017).   Orders:  No orders of the defined types were placed in this encounter.  No orders of the defined types were placed in this encounter.     Procedures: No procedures performed   Clinical Data: No additional findings.   Subjective: Chief Complaint  Patient presents with  . Lower Back - Pain, Follow-up    MRI Lumbar Review    HPI 42 year old male returns with ongoing problems with back pain and right leg pain.  He has been out of work for about a month and a half.  MRI scan is been obtained and is available for review.  Patient's had some problems with his back off and on for a few years.  Last 2 months he is had significant increase in back pain and right leg pain.  He states after he works a 3-day weekend working 12-hour shifts he always has to spend 1 or 2 days laying flat on his back with minimal moving due to increased back pain and more recently the pain into his right leg is been worse.  He denies bowel or bladder symptoms.  Review of Systems updated and 14 point unchanged from 10/28/2017.  Of note is history of PE 2006   Objective: Vital Signs: BP (!) 137/92   Pulse 67   Ht 5\' 9"  (1.753 m)   Wt 170 lb (77.1 kg)   BMI 25.10 kg/m   Physical Exam  Constitutional: He is oriented  to person, place, and time. He appears well-developed and well-nourished.  HENT:  Head: Normocephalic and atraumatic.  Eyes: Pupils are equal, round, and reactive to light. EOM are normal.  Neck: No tracheal deviation present. No thyromegaly present.  Cardiovascular: Normal rate.  Pulmonary/Chest: Effort normal. He has no wheezes.  Abdominal: Soft. Bowel sounds are normal.  Neurological: He is alert and oriented to person, place, and time.  Skin: Skin is warm and dry. Capillary refill takes less than 2 seconds.  Psychiatric: He has a normal mood and affect. His behavior is normal. Judgment and thought content normal.    Ortho Exam pain with straight leg raising.  Sciatic notch tenderness on the right.  Forward flexion reproduces back and right leg pain.  No trochanteric bursal tenderness normal hip range of motion.  Reflexes are intact.  He is able heel and toe walk.  Specialty Comments:  No specialty comments available.  Imaging: CLINICAL DATA:  42 year old male with chronic central lumbar back pain radiating to the right hip. No known injury. Steroid injection 3 months ago without durable improvement.  EXAM: MRI LUMBAR SPINE WITHOUT CONTRAST  TECHNIQUE: Multiplanar, multisequence MR imaging of the lumbar spine was performed. No intravenous contrast was administered.  COMPARISON:  Lumbar radiographs 10/22/2017 and earlier.  FINDINGS: Segmentation:  Normal on the comparison radiographs.  Alignment: Improved lumbar lordosis from the recent radiographs. No spondylolisthesis.  Vertebrae: Low level degenerative appearing endplate marrow edema at L4-L5. There are small superimposed endplate Schmorl's nodes at that level (series 4, image 8). See additional disc findings below.  Elsewhere bone marrow signal is mildly heterogeneous but within normal limits. No other marrow edema or acute osseous abnormality.  Conus medullaris and cauda equina: Conus extends to the  T12-L1 level. No lower spinal cord or conus signal abnormality.  Paraspinal and other soft tissues: Negative.  Disc levels:  T11-T12: Negative.  T12-L1:  Negative.  L1-L2:  Negative.  L2-L3:  Negative.  L3-L4: Mild disc desiccation and disc space loss. Mild circumferential disc bulge and endplate spurring. No stenosis.  L4-L5: Disc desiccation and disc space loss. Circumferential disc bulge. Superimposed left paracentral disc protrusion (series 5, image 31) and also small endplate Schmorl's nodes. Borderline to mild facet and ligament flavum hypertrophy. Mild to moderate left lateral recess stenosis (left L5 nerve level) without spinal stenosis. No convincing foraminal stenosis.  L5-S1: Disc desiccation and disc space loss with small right paracentral disc protrusion but also an adjacent small cephalad extruded disc fragment. The fragment measures about 6 millimeters and is most apparent on series 2, image 8. See also series 6, image 36. It is in proximity to the descending S1 nerve roots in the lateral recesses, more so the right. No significant spinal stenosis. Mild facet hypertrophy, but no convincing foraminal stenosis.  IMPRESSION: 1. Lumbar disc degeneration at L4-L5 and L5-S1. Low level degenerative endplate marrow edema at L4-L5 with small superimposed Schmorl's nodes. 2. The symptomatic level with regard to pain radiating to the right is probably L5-S1 where a small right paracentral disc protrusion is associated with a small 6 mm sequestered disc fragment in proximity to the descending right S1 nerve roots in the lateral recess. No significant spinal or foraminal stenosis. 3. At L4-L5 a small left paracentral disc protrusion is superimposed on disc bulging with up to moderate left lateral recess stenosis at the left L5 nerve level. No significant spinal or foraminal stenosis.   Electronically Signed   By: Odessa Fleming M.D.   On: 11/02/2017  09:11   PMFS History: Patient Active Problem List   Diagnosis Date Noted  . Lip swelling 08/16/2015   Past Medical History:  Diagnosis Date  . Back pain   . Pulmonary embolism (HCC) 2006    Family History  Problem Relation Age of Onset  . Hypertension Paternal Grandmother   . Hypertension Mother   . Diabetes Maternal Uncle   . Diabetes Paternal Uncle     No past surgical history on file. Social History   Occupational History  . Not on file  Tobacco Use  . Smoking status: Current Some Day Smoker    Packs/day: 0.20    Years: 3.00    Pack years: 0.60    Types: Cigarettes  . Smokeless tobacco: Never Used  Substance and Sexual Activity  . Alcohol use: Yes    Alcohol/week: 0.0 standard drinks    Comment: occasional  . Drug use: No  . Sexual activity: Never    Birth control/protection: Abstinence

## 2017-11-23 ENCOUNTER — Ambulatory Visit (INDEPENDENT_AMBULATORY_CARE_PROVIDER_SITE_OTHER): Payer: 59 | Admitting: Physical Medicine and Rehabilitation

## 2017-11-23 ENCOUNTER — Ambulatory Visit (INDEPENDENT_AMBULATORY_CARE_PROVIDER_SITE_OTHER): Payer: Self-pay

## 2017-11-23 ENCOUNTER — Encounter (INDEPENDENT_AMBULATORY_CARE_PROVIDER_SITE_OTHER): Payer: Self-pay | Admitting: Physical Medicine and Rehabilitation

## 2017-11-23 VITALS — BP 143/96 | HR 80 | Temp 98.5°F

## 2017-11-23 DIAGNOSIS — M5416 Radiculopathy, lumbar region: Secondary | ICD-10-CM | POA: Diagnosis not present

## 2017-11-23 DIAGNOSIS — M5116 Intervertebral disc disorders with radiculopathy, lumbar region: Secondary | ICD-10-CM

## 2017-11-23 MED ORDER — BETAMETHASONE SOD PHOS & ACET 6 (3-3) MG/ML IJ SUSP
12.0000 mg | Freq: Once | INTRAMUSCULAR | Status: AC
  Administered 2017-11-23: 12 mg

## 2017-11-23 NOTE — Patient Instructions (Signed)

## 2017-11-23 NOTE — Procedures (Signed)
S1 Lumbosacral Transforaminal Epidural Steroid Injection - Sub-Pedicular Approach with Fluoroscopic Guidance   Patient: David Barker      Date of Birth: 05/15/1975 MRN: [id number] PCP: Shirline Frees, NP      Visit Date: 11/23/2017   Universal Protocol:    Date/Time: 11/04/194:54 PM  Consent Given By: the patient  Position:  PRONE  Additional Comments: Vital signs were monitored before and after the procedure. Patient was prepped and draped in the usual sterile fashion. The correct patient, procedure, and site was verified.   Injection Procedure Details:  Procedure Site One Meds Administered:  Meds ordered this encounter  Medications  . betamethasone acetate-betamethasone sodium phosphate (CELESTONE) injection 12 mg    Laterality: Right  Location/Site:  S1 Foramen   Needle size: 22 ga.  Needle type: Spinal  Needle Placement: Transforaminal  Findings:   -Comments: Excellent flow of contrast along the nerve and into the epidural space.  Procedure Details: After squaring off the sacral end-plate to get a true AP view, the C-arm was positioned so that the best possible view of the S1 foramen was visualized. The soft tissues overlying this structure were infiltrated with 2-3 ml. of 1% Lidocaine without Epinephrine.    The spinal needle was inserted toward the target using a "trajectory" view along the fluoroscope beam.  Under AP and lateral visualization, the needle was advanced so it did not puncture dura. Biplanar projections were used to confirm position. Aspiration was confirmed to be negative for CSF and/or blood. A 1-2 ml. volume of Isovue-250 was injected and flow of contrast was noted at each level. Radiographs were obtained for documentation purposes.   After attaining the desired flow of contrast documented above, a 0.5 to 1.0 ml test dose of 0.25% Marcaine was injected into each respective transforaminal space.  The patient was observed for 90 seconds post  injection.  After no sensory deficits were reported, and normal lower extremity motor function was noted,   the above injectate was administered so that equal amounts of the injectate were placed at each foramen (level) into the transforaminal epidural space.   Additional Comments:  The patient tolerated the procedure well Dressing: Band-Aid    Post-procedure details: Patient was observed during the procedure. Post-procedure instructions were reviewed.  Patient left the clinic in stable condition.

## 2017-11-23 NOTE — Progress Notes (Signed)
 .  Numeric Pain Rating Scale and Functional Assessment Average Pain 8   In the last MONTH (on 0-10 scale) has pain interfered with the following?  1. General activity like being  able to carry out your everyday physical activities such as walking, climbing stairs, carrying groceries, or moving a chair?  Rating(4)   +Driver, -BT, -Dye Allergies.  

## 2017-12-02 ENCOUNTER — Ambulatory Visit (INDEPENDENT_AMBULATORY_CARE_PROVIDER_SITE_OTHER): Payer: 59 | Admitting: Orthopaedic Surgery

## 2017-12-02 ENCOUNTER — Encounter (INDEPENDENT_AMBULATORY_CARE_PROVIDER_SITE_OTHER): Payer: Self-pay | Admitting: Orthopaedic Surgery

## 2017-12-02 VITALS — BP 127/87 | HR 74 | Ht 69.0 in | Wt 170.0 lb

## 2017-12-02 DIAGNOSIS — M5116 Intervertebral disc disorders with radiculopathy, lumbar region: Secondary | ICD-10-CM | POA: Diagnosis not present

## 2017-12-02 NOTE — Progress Notes (Signed)
Office Visit Note   Patient: David Barker           Date of Birth: 09/25/1975           MRN: [id number] Visit Date: 12/02/2017              Requested by: Shirline FreesNafziger, Cory, NP [address] [place], [place] [id number] PCP: Shirline FreesNafziger, Cory, NP   Assessment & Plan: Visit Diagnoses:  1. Lumbar disc herniation with radiculopathy     Plan: We reviewed again today as MRI scan.  He has 2 level disc herniation that is progressed with disc degeneration at both levels since 2015.  He has a left small paracentral disc protrusion with moderate left lateral recess at L4-5 but more pain on the right.  At L5-S1 he has a small paracentral disc protrusion with 6 mm sequestered disc fragment contacting the descending S1 nerve root consistent with his symptoms.  We discussed with him that if he has recurrent symptoms that operative surgery would be considered.  This would be 2 level microdiscectomy including both L4-5 and L5-S1 levels.  He does not have instability would not require a fusion.  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  I plan to recheck him in 2 months.  His current job involves repetitive lifting and has been chronically exacerbating his back symptoms with radicular right leg pain more than left.  Follow-Up Instructions: Return in about 2 months (around 02/01/2018).   Orders:  No orders of the defined types were placed in this encounter.  No orders of the defined types were placed in this encounter.     Procedures: No procedures performed   Clinical Data: No additional findings.   Subjective: Chief Complaint  Patient presents with  . Lower Back - Pain, Follow-up    11/23/17 Lumbar ESI    HPI 42 year old male returns post epidural for L4-5 L5-S1 disc protrusion with right greater than left leg pain.  He is gotten 80% relief of his preop pain with the injection.  He states he is ready to resume work on 12/11/2017.  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 Review of Systems 14 point review of systems updated unchanged from 10/28/2017 office note.  He did have pulmonary embolism in 2006.  Smokes less than 1/4 pack/day smoking cessation has been discussed.  Otherwise negative other than as mentioned in HPI.   Objective: Vital Signs: BP 127/87   Pulse 74   Ht 5\' 9"  (1.753 m)   Wt 170 lb (77.1 kg)   BMI 25.10 kg/m   Physical Exam  Constitutional: He is oriented to person, place, and time. He appears well-developed and well-nourished.  HENT:  Head: Normocephalic and atraumatic.  Eyes: Pupils are equal, round, and reactive to light. EOM are normal.  Neck: No tracheal deviation present. No thyromegaly present.  Cardiovascular: Normal rate.  Pulmonary/Chest: Effort normal. He has no wheezes.  Abdominal: Soft. Bowel sounds are normal.  Neurological: He is alert and oriented to person, place, and time.  Skin: Skin is warm and dry. Capillary refill takes less than 2 seconds.  Psychiatric: He has a normal mood and affect. His behavior is normal. Judgment and thought content normal.    Ortho Exam patient gets from sitting standing comfortably.  He can heel and toe walk.  He has some discomfort with straight leg raising on the right at 90 degrees.  Mild right sciatic notch tenderness.  Negative logroll to the hips anterior tib gastrocsoleus is strong no atrophy.  Distal pulses are 2+.  Specialty Comments:  No specialty comments available.  Imaging: CLINICAL DATA:  42 year old male with chronic central lumbar back pain radiating to the right hip. No known injury. Steroid injection 3 months ago without durable improvement.  EXAM: MRI LUMBAR SPINE WITHOUT CONTRAST  TECHNIQUE: Multiplanar, multisequence MR imaging of the lumbar spine was performed. No intravenous contrast was  administered.  COMPARISON:  Lumbar radiographs 10/22/2017 and earlier.  FINDINGS: Segmentation:  Normal on the comparison radiographs.  Alignment: Improved lumbar lordosis from the recent radiographs. No spondylolisthesis.  Vertebrae: Low level degenerative appearing endplate marrow edema at L4-L5. There are small superimposed endplate Schmorl's nodes at that level (series 4, image 8). See additional disc findings below.  Elsewhere bone marrow signal is mildly heterogeneous but within normal limits. No other marrow edema or acute osseous abnormality.  Conus medullaris and cauda equina: Conus extends to the T12-L1 level. No lower spinal cord or conus signal abnormality.  Paraspinal and other soft tissues: Negative.  Disc levels:  T11-T12: Negative.  T12-L1:  Negative.  L1-L2:  Negative.  L2-L3:  Negative.  L3-L4: Mild disc desiccation and disc space loss. Mild circumferential disc bulge and endplate spurring. No stenosis.  L4-L5: Disc desiccation and disc space loss. Circumferential disc bulge. Superimposed left paracentral disc protrusion (series 5, image 31) and also small endplate Schmorl's nodes. Borderline to mild facet and ligament flavum hypertrophy. Mild to moderate left lateral recess stenosis (left L5 nerve level) without spinal stenosis. No convincing foraminal stenosis.  L5-S1: Disc desiccation and disc space loss with small right paracentral disc protrusion but also an adjacent small cephalad extruded disc fragment. The fragment measures about 6 millimeters and is most apparent on series 2, image 8. See also series 6, image 36. It is in proximity to the descending S1 nerve roots in the lateral recesses, more so the right. No significant spinal stenosis. Mild facet hypertrophy, but no convincing foraminal stenosis.  IMPRESSION: 1. Lumbar disc degeneration at L4-L5 and L5-S1. Low level degenerative endplate marrow edema at L4-L5 with small  superimposed Schmorl's nodes. 2. The symptomatic level with regard to pain radiating to the right is probably L5-S1 where a small right paracentral disc protrusion is associated with a small 6 mm sequestered disc fragment in proximity to the descending right S1 nerve roots in the lateral recess. No significant spinal or foraminal stenosis. 3. At L4-L5 a small left paracentral disc protrusion is superimposed on disc bulging with up to moderate left lateral recess stenosis at the left L5 nerve level. No significant spinal or foraminal stenosis.   Electronically Signed   By: Odessa Fleming M.D.   On: 11/02/2017 09:11   PMFS History: Patient Active Problem List   Diagnosis Date Noted  . Lip swelling 08/16/2015   Past Medical History:  Diagnosis Date  . Back pain   . Pulmonary embolism (HCC) 2006    Family History  Problem Relation Age of Onset  . Hypertension Paternal Grandmother   . Hypertension Mother   . Diabetes Maternal Uncle   . Diabetes Paternal Uncle     No past surgical history on file. Social History   Occupational History  . Not on file  Tobacco Use  . Smoking status: Current Some Day Smoker    Packs/day: 0.20  Years: 3.00    Pack years: 0.60    Types: Cigarettes  . Smokeless tobacco: Never Used  Substance and Sexual Activity  . Alcohol use: Yes    Alcohol/week: 0.0 standard drinks    Comment: occasional  . Drug use: No  . Sexual activity: Never    Birth control/protection: Abstinence

## 2017-12-03 NOTE — Progress Notes (Signed)
David Barker - 42 y.o. male MRN [id number]  Date of birth: 24-Sep-1975  Office Visit Note: Visit Date: 11/23/2017 PCP: Shirline Frees, NP Referred by: Shirline Frees, NP  Subjective: Chief Complaint  Patient presents with  . Lower Back - Pain  . Right Hip - Pain   HPI:  David Barker is a 42 y.o. male who comes in today At the request of Dr. Dr. Annell Greening for right S1 transforaminal epidural steroid injection.  Patient has lumbar disc herniation extrusion at L5-S1 to fit with his symptoms.  He is failed conservative care otherwise.  This is diagnostic and hopefully therapeutic.  ROS Otherwise per HPI.  Assessment & Plan: Visit Diagnoses:  1. Lumbar radiculopathy   2. Radiculopathy due to lumbar intervertebral disc disorder     Plan: No additional findings.   Meds & Orders:  Meds ordered this encounter  Medications  . betamethasone acetate-betamethasone sodium phosphate (CELESTONE) injection 12 mg    Orders Placed This Encounter  Procedures  . XR C-ARM NO REPORT  . Epidural Steroid injection    Follow-up: Return for Annell Greening, MD.   Procedures: No procedures performed  S1 Lumbosacral Transforaminal Epidural Steroid Injection - Sub-Pedicular Approach with Fluoroscopic Guidance   Patient: David Barker      Date of Birth: 08/08/1975 MRN: [id number] PCP: Shirline Frees, NP      Visit Date: 11/23/2017   Universal Protocol:    Date/Time: 11/04/194:54 PM  Consent Given By: the patient  Position:  PRONE  Additional Comments: Vital signs were monitored before and after the procedure. Patient was prepped and draped in the usual sterile fashion. The correct patient, procedure, and site was verified.   Injection Procedure Details:  Procedure Site One Meds Administered:  Meds ordered this encounter  Medications  . betamethasone acetate-betamethasone sodium phosphate (CELESTONE) injection 12 mg    Laterality: Right  Location/Site:  S1 Foramen   Needle size: 22  ga.  Needle type: Spinal  Needle Placement: Transforaminal  Findings:   -Comments: Excellent flow of contrast along the nerve and into the epidural space.  Procedure Details: After squaring off the sacral end-plate to get a true AP view, the C-arm was positioned so that the best possible view of the S1 foramen was visualized. The soft tissues overlying this structure were infiltrated with 2-3 ml. of 1% Lidocaine without Epinephrine.    The spinal needle was inserted toward the target using a "trajectory" view along the fluoroscope beam.  Under AP and lateral visualization, the needle was advanced so it did not puncture dura. Biplanar projections were used to confirm position. Aspiration was confirmed to be negative for CSF and/or blood. A 1-2 ml. volume of Isovue-250 was injected and flow of contrast was noted at each level. Radiographs were obtained for documentation purposes.   After attaining the desired flow of contrast documented above, a 0.5 to 1.0 ml test dose of 0.25% Marcaine was injected into each respective transforaminal space.  The patient was observed for 90 seconds post injection.  After no sensory deficits were reported, and normal lower extremity motor function was noted,   the above injectate was administered so that equal amounts of the injectate were placed at each foramen (level) into the transforaminal epidural space.   Additional Comments:  The patient tolerated the procedure well Dressing: Band-Aid    Post-procedure details: Patient was observed during the procedure. Post-procedure instructions were reviewed.  Patient left the clinic in stable condition.    Clinical History:  MRI LUMBAR SPINE WITHOUT CONTRAST  TECHNIQUE: Multiplanar, multisequence MR imaging of the lumbar spine was performed. No intravenous contrast was administered.  COMPARISON:  Lumbar radiographs 10/22/2017 and earlier.  FINDINGS: Segmentation:  Normal on the comparison  radiographs.  Alignment: Improved lumbar lordosis from the recent radiographs. No spondylolisthesis.  Vertebrae: Low level degenerative appearing endplate marrow edema at L4-L5. There are small superimposed endplate Schmorl's nodes at that level (series 4, image 8). See additional disc findings below.  Elsewhere bone marrow signal is mildly heterogeneous but within normal limits. No other marrow edema or acute osseous abnormality.  Conus medullaris and cauda equina: Conus extends to the T12-L1 level. No lower spinal cord or conus signal abnormality.  Paraspinal and other soft tissues: Negative.  Disc levels:  T11-T12: Negative.  T12-L1:  Negative.  L1-L2:  Negative.  L2-L3:  Negative.  L3-L4: Mild disc desiccation and disc space loss. Mild circumferential disc bulge and endplate spurring. No stenosis.  L4-L5: Disc desiccation and disc space loss. Circumferential disc bulge. Superimposed left paracentral disc protrusion (series 5, image 31) and also small endplate Schmorl's nodes. Borderline to mild facet and ligament flavum hypertrophy. Mild to moderate left lateral recess stenosis (left L5 nerve level) without spinal stenosis. No convincing foraminal stenosis.  L5-S1: Disc desiccation and disc space loss with small right paracentral disc protrusion but also an adjacent small cephalad extruded disc fragment. The fragment measures about 6 millimeters and is most apparent on series 2, image 8. See also series 6, image 36. It is in proximity to the descending S1 nerve roots in the lateral recesses, more so the right. No significant spinal stenosis. Mild facet hypertrophy, but no convincing foraminal stenosis.  IMPRESSION: 1. Lumbar disc degeneration at L4-L5 and L5-S1. Low level degenerative endplate marrow edema at L4-L5 with small superimposed Schmorl's nodes. 2. The symptomatic level with regard to pain radiating to the right is probably L5-S1 where a  small right paracentral disc protrusion is associated with a small 6 mm sequestered disc fragment in proximity to the descending right S1 nerve roots in the lateral recess. No significant spinal or foraminal stenosis. 3. At L4-L5 a small left paracentral disc protrusion is superimposed on disc bulging with up to moderate left lateral recess stenosis at the left L5 nerve level. No significant spinal or foraminal stenosis.   Electronically Signed   By: Odessa FlemingH  Hall M.D.   On: 11/02/2017 09:11     Objective:  VS:  HT:    WT:   BMI:     BP:(!) 143/96  HR:80bpm  TEMP:98.5 F (36.9 C)(Oral)  RESP:  Physical Exam  Ortho Exam Imaging: No results found.

## 2017-12-11 ENCOUNTER — Telehealth (INDEPENDENT_AMBULATORY_CARE_PROVIDER_SITE_OTHER): Payer: Self-pay | Admitting: Orthopaedic Surgery

## 2017-12-11 NOTE — Telephone Encounter (Signed)
Ok for note 

## 2017-12-11 NOTE — Telephone Encounter (Signed)
Patient called stating requesting a RTW note that specifies no restrictions.  He asked if you could fax it to his employer at [phone].  CB#[phone].  Thank you.

## 2017-12-11 NOTE — Telephone Encounter (Signed)
OK - thanks

## 2017-12-14 NOTE — Telephone Encounter (Signed)
Note completed and faxed to patient's employer per request.

## 2018-02-02 ENCOUNTER — Ambulatory Visit (INDEPENDENT_AMBULATORY_CARE_PROVIDER_SITE_OTHER): Payer: 59 | Admitting: Orthopaedic Surgery

## 2018-11-20 ENCOUNTER — Other Ambulatory Visit: Payer: Self-pay

## 2018-11-20 ENCOUNTER — Emergency Department (HOSPITAL_COMMUNITY)
Admission: EM | Admit: 2018-11-20 | Discharge: 2018-11-21 | Disposition: A | Discharge status: Home / Self Care | Payer: 59 | Attending: Emergency Medicine | Admitting: Emergency Medicine

## 2018-11-20 ENCOUNTER — Emergency Department (HOSPITAL_COMMUNITY): Payer: 59

## 2018-11-20 ENCOUNTER — Encounter (HOSPITAL_COMMUNITY): Payer: Self-pay | Admitting: Emergency Medicine

## 2018-11-20 DIAGNOSIS — R1011 Right upper quadrant pain: Secondary | ICD-10-CM | POA: Insufficient documentation

## 2018-11-20 DIAGNOSIS — F17210 Nicotine dependence, cigarettes, uncomplicated: Secondary | ICD-10-CM | POA: Insufficient documentation

## 2018-11-20 DIAGNOSIS — R61 Generalized hyperhidrosis: Secondary | ICD-10-CM | POA: Diagnosis not present

## 2018-11-20 DIAGNOSIS — Z79899 Other long term (current) drug therapy: Secondary | ICD-10-CM | POA: Insufficient documentation

## 2018-11-20 DIAGNOSIS — R0789 Other chest pain: Secondary | ICD-10-CM | POA: Diagnosis not present

## 2018-11-20 LAB — CBC
HCT: 50.2 % (ref 39.0–52.0)
Hemoglobin: 17.3 g/dL — ABNORMAL HIGH (ref 13.0–17.0)
MCH: 32 pg (ref 26.0–34.0)
MCHC: 34.5 g/dL (ref 30.0–36.0)
MCV: 92.8 fL (ref 80.0–100.0)
Platelets: 285 10*3/uL (ref 150–400)
RBC: 5.41 MIL/uL (ref 4.22–5.81)
RDW: 13.5 % (ref 11.5–15.5)
WBC: 8.5 10*3/uL (ref 4.0–10.5)
nRBC: 0 % (ref 0.0–0.2)

## 2018-11-20 LAB — COMPREHENSIVE METABOLIC PANEL
ALT: 39 U/L (ref 0–44)
AST: 50 U/L — ABNORMAL HIGH (ref 15–41)
Albumin: 4.9 g/dL (ref 3.5–5.0)
Alkaline Phosphatase: 105 U/L (ref 38–126)
Anion gap: 15 (ref 5–15)
BUN: 8 mg/dL (ref 6–20)
CO2: 23 mmol/L (ref 22–32)
Calcium: 9.5 mg/dL (ref 8.9–10.3)
Chloride: 98 mmol/L (ref 98–111)
Creatinine, Ser: 0.91 mg/dL (ref 0.61–1.24)
GFR calc Af Amer: >60 mL/min (ref >60)
GFR calc non Af Amer: >60 mL/min (ref >60)
Glucose, Bld: 113 mg/dL — ABNORMAL HIGH (ref 70–99)
Potassium: 3.8 mmol/L (ref 3.5–5.1)
Sodium: 136 mmol/L (ref 135–145)
Total Bilirubin: 2 mg/dL — ABNORMAL HIGH (ref 0.3–1.2)
Total Protein: 8.3 g/dL — ABNORMAL HIGH (ref 6.5–8.1)

## 2018-11-20 LAB — URINALYSIS, ROUTINE W REFLEX MICROSCOPIC
Bacteria, UA: NONE SEEN
Bilirubin Urine: NEGATIVE
Glucose, UA: NEGATIVE mg/dL
Hgb urine dipstick: NEGATIVE
Ketones, ur: 5 mg/dL — AB
Nitrite: NEGATIVE
Protein, ur: 100 mg/dL — AB
Specific Gravity, Urine: 1.028 (ref 1.005–1.030)
pH: 7 (ref 5.0–8.0)

## 2018-11-20 LAB — LIPASE, BLOOD: Lipase: 36 U/L (ref 11–51)

## 2018-11-20 LAB — ETHANOL: Alcohol, Ethyl (B): <10 mg/dL (ref <10)

## 2018-11-20 MED ORDER — SODIUM CHLORIDE 0.9 % IV BOLUS
1000.0000 mL | Freq: Once | INTRAVENOUS | Status: AC
  Administered 2018-11-20: 1000 mL via INTRAVENOUS

## 2018-11-20 MED ORDER — FENTANYL CITRATE (PF) 100 MCG/2ML IJ SOLN
100.0000 ug | Freq: Once | INTRAMUSCULAR | Status: AC
  Administered 2018-11-21: 01:00:00 100 ug via INTRAVENOUS
  Filled 2018-11-20: qty 2

## 2018-11-20 MED ORDER — ONDANSETRON HCL 4 MG/2ML IJ SOLN
4.0000 mg | Freq: Once | INTRAMUSCULAR | Status: AC
  Administered 2018-11-20: 22:00:00 4 mg via INTRAVENOUS
  Filled 2018-11-20: qty 2

## 2018-11-20 MED ORDER — LORAZEPAM 2 MG/ML IJ SOLN
1.0000 mg | Freq: Once | INTRAMUSCULAR | Status: AC
  Administered 2018-11-20: 1 mg via INTRAVENOUS
  Filled 2018-11-20: qty 1

## 2018-11-20 MED ORDER — SODIUM CHLORIDE 0.9% FLUSH: 3.0000 mL | Freq: Once | INTRAVENOUS | Status: DC

## 2018-11-20 NOTE — ED Triage Notes (Signed)
Patient states he started having upper abdominal pain for two days. Patient states it got worse this morning. He started sweating and can not stop.

## 2018-11-20 NOTE — ED Provider Notes (Signed)
[facility]-EMERGENCY DEPT Provider Note   CSN: [id number] Arrival date & time: 11/20/18  1832     History   Chief Complaint Chief Complaint  Patient presents with   Abdominal Pain    HPI David Barker is a 43 y.o. male.     HPI He resents for evaluation of right upper quadrant right lower chest wall pain which been present for greater than 1 month.  Pain comes and goes.  He has been having more trouble in the last 2 days, because it keeps him awake at night and he has noticed some sweating.  He denies documented fever.  He denies cough, shortness of breath, weakness or dizziness.  His appetite has been poor for couple of days.  He drinks alcohol, sometimes heavily.  He works a job as a Restaurant manager, fast food.  He describes his job as being physical.  No specific known trauma.  No vomiting, diarrhea or change in urinary habits.  There are no other known modifying factors.   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 No pertinent surgical history.      Home Medications    Prior to Admission medications   Medication Sig Start Date End Date Taking? Authorizing Provider  cyclobenzaprine (FLEXERIL) 10 MG tablet Take 1 tablet (10 mg total) by mouth at bedtime. Patient not taking: Reported on 10/28/2017 10/22/17   Shirline Frees, NP  diclofenac (VOLTAREN) 75 MG EC tablet  10/18/17   [provider]  ibuprofen (ADVIL,MOTRIN) 800 MG tablet ibuprofen 800 mg tablet    [provider]  orphenadrine (NORFLEX) 100 MG tablet  10/18/17   [provider]  varenicline (CHANTIX STARTING MONTH PAK) 0.5 MG X 11 & 1 MG X 42 tablet Take one 0.5 mg tablet by mouth once daily for 3 days, then increase to one 0.5 mg tablet twice daily for 4 days, then increase to  one 1 mg tablet twice daily. 07/09/17   Nafziger, Kandee Keen, NP    Family History Family History  Problem Relation Age of Onset   Hypertension Paternal Grandmother    Hypertension Mother    Diabetes Maternal Uncle    Diabetes Paternal Uncle     Social History Social History   Tobacco Use   Smoking status: Current Some Day Smoker    Packs/day: 0.20    Years: 3.00    Pack years: 0.60    Types: Cigarettes   Smokeless tobacco: Never Used  Substance Use Topics   Alcohol use: Yes    Alcohol/week: 0.0 standard drinks    Comment: occasional   Drug use: No     Allergies   Patient has no known allergies.   Review of Systems Review of Systems  All other systems reviewed and are negative.    Physical Exam Updated Vital Signs BP (!) 143/95    Pulse 72    Temp 99.1 F (37.3 C) (Oral)    Resp 18    Ht 5\' 9"  (1.753 m)    Wt 76 kg    SpO2 96%    BMI 24.74 kg/m   Physical Exam Vitals signs and nursing note reviewed.  Constitutional:      General: He is in acute distress (Uncomfortable).     Appearance: He  is well-developed. He is not ill-appearing, toxic-appearing or diaphoretic.     Comments: He is tremulous  HENT:     Head: Normocephalic and atraumatic.     Right Ear: External ear normal.     Left Ear: External ear normal.  Eyes:     General: No scleral icterus.    Conjunctiva/sclera: Conjunctivae normal.     Pupils: Pupils are equal, round, and reactive to light.  Neck:     Musculoskeletal: Normal range of motion and neck supple.     Trachea: Phonation normal.  Cardiovascular:     Rate and Rhythm: Regular rhythm. Tachycardia present.     Heart sounds: Normal heart sounds.  Pulmonary:     Effort: Pulmonary effort is normal.     Breath sounds: Normal breath sounds.  Abdominal:     General: There is no distension.     Palpations: Abdomen is soft.     Tenderness: There is no abdominal tenderness. There is no guarding.  Musculoskeletal: Normal range of motion.    Skin:    General: Skin is warm and dry.  Neurological:     Mental Status: He is alert and oriented to person, place, and time.     Cranial Nerves: No cranial nerve deficit.     Sensory: No sensory deficit.     Motor: No abnormal muscle tone.     Coordination: Coordination normal.     Comments: No dysarthria or aphasia.  Psychiatric:        Behavior: Behavior normal.        Thought Content: Thought content normal.        Judgment: Judgment normal.     Comments: Anxious      ED Treatments / Results  Labs (all labs ordered are listed, but only abnormal results are displayed) Labs Reviewed  COMPREHENSIVE METABOLIC PANEL - Abnormal; Notable for the following components:      Result Value   Glucose, Bld 113 (*)    Total Protein 8.3 (*)    AST 50 (*)    Total Bilirubin 2.0 (*)    All other components within normal limits  CBC - Abnormal; Notable for the following components:   Hemoglobin 17.3 (*)    All other components within normal limits  URINALYSIS, ROUTINE W REFLEX MICROSCOPIC - Abnormal; Notable for the following components:   Color, Urine AMBER (*)    Ketones, ur 5 (*)    Protein, ur 100 (*)    Leukocytes,Ua TRACE (*)    All other components within normal limits  LIPASE, BLOOD  ETHANOL    EKG None  Radiology No results found.  Procedures Procedures (including critical care time)  Medications Ordered in ED Medications  sodium chloride flush (NS) 0.9 % injection 3 mL (has no administration in time range)  fentaNYL (SUBLIMAZE) injection 100 mcg (has no administration in time range)  ondansetron (ZOFRAN) injection 4 mg (4 mg Intravenous Given 11/20/18 2228)  sodium chloride 0.9 % bolus 1,000 mL (1,000 mLs Intravenous New Bag/Given (Non-Interop) 11/20/18 2227)  LORazepam (ATIVAN) injection 1 mg (1 mg Intravenous Given 11/20/18 2231)     Initial Impression / Assessment and Plan / ED Course  I have reviewed the triage vital signs and the nursing  notes.  Pertinent labs & imaging results that were available during my care of the patient were reviewed by me and considered in my medical decision making (see chart for details).  Clinical Course as of Nov 20 2334  Sat  Nov 20, 2018  2333 Normal  Ethanol [EW]  2333 Normal  Lipase, blood [EW]  2333 Normal except presence of ketones, protein, leukocytes  Urinalysis, Routine w reflex microscopic(!) [EW]  2333 Normal except glucose high, total protein high, AST high, total bilirubin high  Comprehensive metabolic panel(!) [EW]  2333 Normal except hemoglobin high  CBC(!) [EW]    Clinical Course User Index [EW] Mancel BaleWentz, Roxie Kreeger, MD        Patient Vitals for the past 24 hrs:  BP Temp Temp src Pulse Resp SpO2 Height Weight  11/20/18 2236 (!) 143/95 -- -- 72 18 96 % -- --  11/20/18 2122 (!) 155/115 -- -- 93 18 95 % -- --  11/20/18 1843 (!) 169/117 99.1 F (37.3 C) Oral 83 16 100 % 5\' 9"  (1.753 m) 76 kg     Medical Decision Making: Subacute abdominal pain or chest wall pain, and patient who is a heavy drinker.  Mild elevation in total bilirubin, and AST.  Urine specific gravity borderline elevated at 1.028.  Suspect mild dehydration.  Symptomatic care in ED with narcotic algesia, muscle relaxer, and IV fluids.  Abdominal ultrasound ordered to evaluate for right upper quadrant abdominal pain disorders.  CRITICAL CARE-no Performed by: Mancel BaleElliott Tavaras Goody  Nursing Notes Reviewed/ Care Coordinated Applicable Imaging Reviewed Interpretation of Laboratory Data incorporated into ED treatment  Transition of care to Dr. Blinda LeatherwoodPollina at end of shift, he will evaluate after ultrasound imaging and consider appropriate disposition.  Final Clinical Impressions(s) / ED Diagnoses   Final diagnoses:  Right upper quadrant abdominal pain    ED Discharge Orders    None       Mancel BaleWentz, Kaidence Sant, MD 11/21/18 0008

## 2018-11-21 MED ORDER — OXYCODONE-ACETAMINOPHEN 5-325 MG PO TABS: 1.0000 | ORAL_TABLET | Freq: Once | ORAL | Status: DC

## 2018-11-21 MED ORDER — CHLORDIAZEPOXIDE HCL 25 MG PO CAPS: ORAL_CAPSULE | ORAL | 0 refills | Status: DC

## 2018-11-21 MED ORDER — SUCRALFATE 1 G PO TABS: 1.0000 g | ORAL_TABLET | Freq: Three times a day (TID) | ORAL | 0 refills | Status: DC

## 2018-11-21 MED ORDER — PANTOPRAZOLE SODIUM 40 MG PO TBEC: 40.0000 mg | DELAYED_RELEASE_TABLET | Freq: Every day | ORAL | 3 refills | Status: DC

## 2018-11-21 NOTE — ED Provider Notes (Signed)
Patient signed out to me by Dr. Eulis Foster to follow-up on ultrasound.  Patient complaining of upper abdominal discomfort at presentation.  He does have a history of alcohol abuse.  Ultrasound is normal, no signs of acute gallbladder disease.  All blood work normal including white blood cell count and lipase.  Patient mildly tremulous, likely having some withdrawal.  Will discharge with treatment for alcoholic gastritis, withdrawal symptoms and give resources for outpatient treatment of alcohol abuse.   Orpah Greek, MD 11/21/18 [phone]

## 2019-02-14 DIAGNOSIS — Z03818 Encounter for observation for suspected exposure to other biological agents ruled out: Secondary | ICD-10-CM | POA: Diagnosis not present

## 2019-02-14 DIAGNOSIS — Z20828 Contact with and (suspected) exposure to other viral communicable diseases: Secondary | ICD-10-CM | POA: Diagnosis not present

## 2019-10-24 ENCOUNTER — Telehealth: Payer: Self-pay | Admitting: Adult Health

## 2019-10-24 ENCOUNTER — Encounter: Payer: Self-pay | Admitting: Family Medicine

## 2019-10-24 ENCOUNTER — Telehealth: Payer: Self-pay | Admitting: Family Medicine

## 2019-10-24 ENCOUNTER — Ambulatory Visit (INDEPENDENT_AMBULATORY_CARE_PROVIDER_SITE_OTHER): Payer: BC Managed Care – PPO | Admitting: Family Medicine

## 2019-10-24 ENCOUNTER — Other Ambulatory Visit: Payer: Self-pay

## 2019-10-24 VITALS — BP 124/88 | HR 79 | Temp 98.9°F

## 2019-10-24 DIAGNOSIS — M5116 Intervertebral disc disorders with radiculopathy, lumbar region: Secondary | ICD-10-CM

## 2019-10-24 MED ORDER — MELOXICAM 7.5 MG PO TABS: 7.5000 mg | ORAL_TABLET | Freq: Every day | ORAL | 0 refills | Status: DC

## 2019-10-24 MED ORDER — PREDNISONE 10 MG PO TABS: ORAL_TABLET | ORAL | 0 refills | Status: DC

## 2019-10-24 NOTE — Telephone Encounter (Signed)
No longer needed

## 2019-10-24 NOTE — Telephone Encounter (Signed)
Pt was called and informed that the work note was just for to day not taking him out of work.  Pt is stated that he would like to make an appointment with his provider due to him not being able to work and lift heavy object for 12 hours.

## 2019-10-24 NOTE — Patient Instructions (Addendum)
Acute Back Pain, Adult Acute back pain is sudden and usually short-lived. It is often caused by an injury to the muscles and tissues in the back. The injury may result from:  A muscle or ligament getting overstretched or torn (strained). Ligaments are tissues that connect bones to each other. Lifting something improperly can cause a back strain.  Wear and tear (degeneration) of the spinal disks. Spinal disks are circular tissue that provides cushioning between the bones of the spine (vertebrae).  Twisting motions, such as while playing sports or doing yard work.  A hit to the back.  Arthritis. You may have a physical exam, lab tests, and imaging tests to find the cause of your pain. Acute back pain usually goes away with rest and home care. Follow these instructions at home: Managing pain, stiffness, and swelling  Take over-the-counter and prescription medicines only as told by your health care provider.  Your health care provider may recommend applying ice during the first 24-48 hours after your pain starts. To do this: ? Put ice in a plastic bag. ? Place a towel between your skin and the bag. ? Leave the ice on for 20 minutes, 2-3 times a day.  If directed, apply heat to the affected area as often as told by your health care provider. Use the heat source that your health care provider recommends, such as a moist heat pack or a heating pad. ? Place a towel between your skin and the heat source. ? Leave the heat on for 20-30 minutes. ? Remove the heat if your skin turns bright red. This is especially important if you are unable to feel pain, heat, or cold. You have a greater risk of getting burned. Activity   Do not stay in bed. Staying in bed for more than 1-2 days can delay your recovery.  Sit up and stand up straight. Avoid leaning forward when you sit, or hunching over when you stand. ? If you work at a desk, sit close to it so you do not need to lean over. Keep your chin tucked  in. Keep your neck drawn back, and keep your elbows bent at a right angle. Your arms should look like the letter "L." ? Sit high and close to the steering wheel when you drive. Add lower back (lumbar) support to your car seat, if needed.  Take short walks on even surfaces as soon as you are able. Try to increase the length of time you walk each day.  Do not sit, drive, or stand in one place for more than 30 minutes at a time. Sitting or standing for long periods of time can put stress on your back.  Do not drive or use heavy machinery while taking prescription pain medicine.  Use proper lifting techniques. When you bend and lift, use positions that put less stress on your back: ? Bend your knees. ? Keep the load close to your body. ? Avoid twisting.  Exercise regularly as told by your health care provider. Exercising helps your back heal faster and helps prevent back injuries by keeping muscles strong and flexible.  Work with a physical therapist to make a safe exercise program, as recommended by your health care provider. Do any exercises as told by your physical therapist. Lifestyle  Maintain a healthy weight. Extra weight puts stress on your back and makes it difficult to have good posture.  Avoid activities or situations that make you feel anxious or stressed. Stress and anxiety increase muscle   tension and can make back pain worse. Learn ways to manage anxiety and stress, such as through exercise. General instructions  Sleep on a firm mattress in a comfortable position. Try lying on your side with your knees slightly bent. If you lie on your back, put a pillow under your knees.  Follow your treatment plan as told by your health care provider. This may include: ? Cognitive or behavioral therapy. ? Acupuncture or massage therapy. ? Meditation or yoga. Contact a health care provider if:  You have pain that is not relieved with rest or medicine.  You have increasing pain going down  into your legs or buttocks.  Your pain does not improve after 2 weeks.  You have pain at night.  You lose weight without trying.  You have a fever or chills. Get help right away if:  You develop new bowel or bladder control problems.  You have unusual weakness or numbness in your arms or legs.  You develop nausea or vomiting.  You develop abdominal pain.  You feel faint. Summary  Acute back pain is sudden and usually short-lived.  Use proper lifting techniques. When you bend and lift, use positions that put less stress on your back.  Take over-the-counter and prescription medicines and apply heat or ice as directed by your health care provider. This information is not intended to replace advice given to you by your health care provider. Make sure you discuss any questions you have with your health care provider. Document Revised: 04/27/2018 Document Reviewed: 08/20/2016 Elsevier Patient Education  2020 [employer]  Lumbosacral Radiculopathy Lumbosacral radiculopathy is a condition that involves the spinal nerves and nerve roots in the low back and bottom of the spine. The condition develops when these nerves and nerve roots move out of place or become inflamed and cause symptoms. What are the causes? This condition may be caused by:  Pressure from a disk that bulges out of place (herniated disk). A disk is a plate of soft cartilage that separates bones in the spine.  Disk changes that occur with age (disk degeneration).  A narrowing of the bones of the lower back (spinal stenosis).  A tumor.  An infection.  An injury that places sudden pressure on the disks that cushion the bones of your lower spine. What increases the risk? You are more likely to develop this condition if:  You are a male who is 76-1 years old.  You are a male who is 39-7 years old.  You use improper technique when lifting things.  You are overweight or live a sedentary  lifestyle.  Your work requires frequent lifting.  You smoke.  You do repetitive activities that strain the spine. What are the signs or symptoms? Symptoms of this condition include:  Pain that goes down from your back into your legs (sciatica), usually on one side of the body. This is the most common symptom. The pain may be worse with sitting, coughing, or sneezing.  Pain and numbness in your legs.  Muscle weakness.  Tingling.  Loss of bladder control or bowel control. How is this diagnosed? This condition may be diagnosed based on:  Your symptoms and medical history.  A physical exam. If the pain is lasting, you may have tests, such as:  MRI scan.  X-ray.  CT scan.  A type of X-ray used to examine the spinal canal after injecting a dye into your spine (myelogram).  A test to measure how electrical impulses move through a  nerve (nerve conduction study). How is this treated? Treatment may depend on the cause of the condition and may include:  Working with a physical therapist.  Taking pain medicine.  Applying heat and ice to affected areas.  Doing stretches to improve flexibility.  Doing exercises to strengthen back muscles.  Having chiropractic spinal manipulation.  Using transcutaneous electrical nerve stimulation (TENS) therapy.  Getting a steroid injection in the spine. In some cases, no treatment is needed. If the condition is long-lasting (chronic), or if symptoms are severe, treatment may involve surgery or lifestyle changes, such as following a weight-loss plan. Follow these instructions at home: Activity  Avoid bending and other activities that make the problem worse.  Maintain a proper position when standing or sitting: ? When standing, keep your upper back and neck straight, with your shoulders pulled back. Avoid slouching. ? When sitting, keep your back straight and relax your shoulders. Do not round your shoulders or pull them backward.  Do  not sit or stand in one place for long periods of time.  Take brief periods of rest throughout the day. This will reduce your pain. It is usually better to rest by lying down or standing, not sitting.  When you are resting for longer periods, mix in some mild activity or stretching between periods of rest. This will help to prevent stiffness and pain.  Get regular exercise. Ask your health care provider what activities are safe for you. If you were shown how to do any exercises or stretches, do them as directed by your health care provider.  Do not lift anything that is heavier than 10 lb (4.5 kg) or the limit that you are told by your health care provider. Always use proper lifting technique, which includes: ? Bending your knees. ? Keeping the load close to your body. ? Avoiding twisting. Managing pain  If directed, put ice on the affected area: ? Put ice in a plastic bag. ? Place a towel between your skin and the bag. ? Leave the ice on for 20 minutes, 2-3 times a day.  If directed, apply heat to the affected area as often as told by your health care provider. Use the heat source that your health care provider recommends, such as a moist heat pack or a heating pad. ? Place a towel between your skin and the heat source. ? Leave the heat on for 20-30 minutes. ? Remove the heat if your skin turns bright red. This is especially important if you are unable to feel pain, heat, or cold. You may have a greater risk of getting burned.  Take over-the-counter and prescription medicines only as told by your health care provider. General instructions  Sleep on a firm mattress in a comfortable position. Try lying on your side with your knees slightly bent. If you lie on your back, put a pillow under your knees.  Do not drive or use heavy machinery while taking prescription pain medicine.  If your health care provider prescribed a diet or exercise program, follow it as directed.  Keep all  follow-up visits as told by your health care provider. This is important. Contact a health care provider if:  Your pain does not improve over time, even when taking pain medicines. Get help right away if:  You develop severe pain.  Your pain suddenly gets worse.  You develop increasing weakness in your legs.  You lose the ability to control your bladder or bowel.  You have difficulty walking or  balancing.  You have a fever. Summary  Lumbosacral radiculopathy is a condition that occurs when the spinal nerves and nerve roots in the lower part of the spine move out of place or become inflamed and cause symptoms.  Symptoms include pain, numbness, and tingling that go down from your back into your legs (sciatica), muscle weakness, and loss of bladder control or bowel control.  If directed, apply ice or heat to the affected area as told by your health care provider.  Follow instructions about activity, rest, and proper lifting technique. This information is not intended to replace advice given to you by your health care provider. Make sure you discuss any questions you have with your health care provider. Document Revised: 12/25/2016 Document Reviewed: 12/25/2016 Elsevier Patient Education  2020 [employer].

## 2019-10-24 NOTE — Telephone Encounter (Signed)
hey mr. David Barker is calling in about his return to work note he stated that it does not have a return to work date on it and he needs it to say 11/02/2019 due to him not being able to get an appointment until 11/01/2019 with the ortho.  Can you assist?  Pt would like to have a call back

## 2019-10-24 NOTE — Progress Notes (Signed)
Subjective:    Patient ID: David Barker, male    DOB: 1975/03/30, 44 y.o.   MRN: [id number]  No chief complaint on file.   HPI Pt is a 44 yo male with pmh sig for h/o lumbar disc herniation, radiculopathy who is followed by Shirline Frees, NP was seen today for acute concern.  Pt states he was helping friends move 2 wks ago, but his back started hurting on Saturday and Sunday.  Pt was unable to go to work.  Pt notes midline low back pain with R sided low back pain that radiates into his R groin.  Pt denies LE weakness or pain.  Pt tried ibuprofen which did not help.  Pt seen by [facility] 2 yrs ago, had back injections.  Patient denies dysuria, nausea, vomiting, hematuria.  Past Medical History:  Diagnosis Date  . Back pain   . Pulmonary embolism (HCC) 2006    No Known Allergies  ROS General: Denies fever, chills, night sweats, changes in weight, changes in appetite HEENT: Denies headaches, ear pain, changes in vision, rhinorrhea, sore throat CV: Denies CP, palpitations, SOB, orthopnea Pulm: Denies SOB, cough, wheezing GI: Denies abdominal pain, nausea, vomiting, diarrhea, constipation GU: Denies dysuria, hematuria, frequency Msk: Denies muscle cramps, joint pains  +low back pain with radiation into radiculopathy Neuro: Denies weakness, numbness, tingling  Skin: Denies rashes, bruising Psych: Denies depression, anxiety, hallucinations      Objective:    Blood pressure 124/88, pulse 79, temperature 98.9 F (37.2 C), temperature source Oral, SpO2 96 %.  Gen. Pleasant, well-nourished, in no distress, normal affect  HEENT: [place]/AT, face symmetric, conjunctiva clear, no scleral icterus, PERRLA, EOMI, nares patent without drainage Lungs: no accessory muscle use, CTAB, no wheezes or rales Cardiovascular: RRR, no m/r/g, no peripheral edema Musculoskeletal: TTP of lower thoracic, lumbar spine, and R sided paraspinal. + straight leg raise b/l.   No deformities, no cyanosis or clubbing,  normal tone Neuro:  A&Ox3, CN II-XII intact, normal gait Skin:  Warm, no lesions/ rash   Wt Readings from Last 3 Encounters:  11/20/18 167 lb 8 oz (76 kg)  12/02/17 170 lb (77.1 kg)  11/18/17 170 lb (77.1 kg)    Lab Results  Component Value Date   WBC 8.5 11/20/2018   HGB 17.3 (H) 11/20/2018   HCT 50.2 11/20/2018   PLT 285 11/20/2018   GLUCOSE 113 (H) 11/20/2018   CHOL 216 (H) 07/09/2017   TRIG 173.0 (H) 07/09/2017   HDL 66.80 07/09/2017   LDLCALC 115 (H) 07/09/2017   ALT 39 11/20/2018   AST 50 (H) 11/20/2018   NA 136 11/20/2018   K 3.8 11/20/2018   CL 98 11/20/2018   CREATININE 0.91 11/20/2018   BUN 8 11/20/2018   CO2 23 11/20/2018   TSH 1.21 07/09/2017   HGBA1C 5.7 07/09/2017    Assessment/Plan:  Lumbar disc herniation with radiculopathy  -Discussed follow-up with Ortho for likely worsened disc herniation -UA negative.  SG 1.030 and protein noted.  Pt encouraged to increase p.o. intake of water -Discussed supportive care including ice, heat, massage, stretching, rest -We will start Mobic and prednisone taper.  Patient declined muscle relaxer at this time -Continue follow-up with Ortho -Given handout - Plan: meloxicam (MOBIC) 7.5 MG tablet, predniSONE (DELTASONE) 10 MG tablet, Ambulatory referral to Orthopedic Surgery  F/u prn with pcp  Abbe Amsterdam, MD

## 2019-10-26 NOTE — Telephone Encounter (Signed)
Pt is scheduled for appointment with Kandee Keen on 10/27/2019

## 2019-10-27 ENCOUNTER — Other Ambulatory Visit: Payer: Self-pay

## 2019-10-27 ENCOUNTER — Encounter: Payer: Self-pay | Admitting: Adult Health

## 2019-10-27 ENCOUNTER — Ambulatory Visit (INDEPENDENT_AMBULATORY_CARE_PROVIDER_SITE_OTHER): Payer: BC Managed Care – PPO | Admitting: Adult Health

## 2019-10-27 ENCOUNTER — Ambulatory Visit (INDEPENDENT_AMBULATORY_CARE_PROVIDER_SITE_OTHER): Payer: BC Managed Care – PPO

## 2019-10-27 VITALS — BP 130/100 | HR 99 | Temp 98.6°F | Ht 69.0 in | Wt 179.6 lb

## 2019-10-27 DIAGNOSIS — M25551 Pain in right hip: Secondary | ICD-10-CM

## 2019-10-27 DIAGNOSIS — M5116 Intervertebral disc disorders with radiculopathy, lumbar region: Secondary | ICD-10-CM | POA: Diagnosis not present

## 2019-10-27 DIAGNOSIS — M545 Low back pain, unspecified: Secondary | ICD-10-CM | POA: Diagnosis not present

## 2019-10-27 NOTE — Progress Notes (Signed)
Subjective:    Patient ID: David Barker, male    DOB: 03-26-1975, 44 y.o.   MRN: [id number]  HPI  44 year old male who  has a past medical history of Back pain and Pulmonary embolism (HCC) (2006).   He presents to the office today for for follow up regarding low back pain.  He was seen by another provider in the office 4 days ago.  At this time he reported that he was helping friends move 2 weeks prior but that his back started hurting 6 days before being seen in the office.  He noted midline low back pain with right-sided low back pain that radiated into the right groin.  He denied lower extremity weakness or pain.  He has been seen by [place] [facility] 2 years ago and had back injections which worked well for him.  He has known disc herniation  He was advised to follow-up with orthopedics, and has upcoming appointment early next week.  He was prescribed a prednisone taper and Mobic but has not started these medications yet.  He continues to complain of low back pain that radiates into the right groin.  He is taking Motrin which helps to some degree but does not alleviate his discomfort.  He denies issues with bowel or bladder  Pain is worse when standing and bending at the waist.  He does not have much discomfort when sitting in a chair  Review of Systems See HPI   Past Medical History:  Diagnosis Date  . Back pain   . Pulmonary embolism (HCC) 2006    Social History   Socioeconomic History  . Marital status: Single    Spouse name: Not on file  . Number of children: Not on file  . Years of education: Not on file  . Highest education level: Not on file  Occupational History  . Not on file  Tobacco Use  . Smoking status: Current Some Day Smoker    Packs/day: 0.20    Years: 3.00    Pack years: 0.60    Types: Cigarettes  . Smokeless tobacco: Never Used  Substance and Sexual Activity  . Alcohol use: Yes    Alcohol/week: 0.0 standard drinks    Comment: occasional  . Drug  use: No  . Sexual activity: Never    Birth control/protection: Abstinence  Other Topics Concern  . Not on file  Social History Narrative   Married    Two children    Social Determinants of Health   Financial Resource Strain:   . Difficulty of Paying Living Expenses: Not on file  Food Insecurity:   . Worried About Programme researcher, broadcasting/film/video in the Last Year: Not on file  . Ran Out of Food in the Last Year: Not on file  Transportation Needs:   . Lack of Transportation (Medical): Not on file  . Lack of Transportation (Non-Medical): Not on file  Physical Activity:   . Days of Exercise per Week: Not on file  . Minutes of Exercise per Session: Not on file  Stress:   . Feeling of Stress : Not on file  Social Connections:   . Frequency of Communication with Friends and Family: Not on file  . Frequency of Social Gatherings with Friends and Family: Not on file  . Attends Religious Services: Not on file  . Active Member of Clubs or Organizations: Not on file  . Attends Banker Meetings: Not on file  . Marital Status: Not on  file  Intimate Partner Violence:   . Fear of Current or Ex-Partner: Not on file  . Emotionally Abused: Not on file  . Physically Abused: Not on file  . Sexually Abused: Not on file    History reviewed. No pertinent surgical history.  Family History  Problem Relation Age of Onset  . Hypertension Paternal Grandmother   . Hypertension Mother   . Diabetes Maternal Uncle   . Diabetes Paternal Uncle     No Known Allergies  Current Outpatient Medications on File Prior to Visit  Medication Sig Dispense Refill  . chlordiazePOXIDE (LIBRIUM) 25 MG capsule 50mg  PO TID x 1D, then 25-50mg  PO BID X 1D, then 25-50mg  PO QD X 1D 10 capsule 0  . orphenadrine (NORFLEX) 100 MG tablet     . pantoprazole (PROTONIX) 40 MG tablet Take 1 tablet (40 mg total) by mouth daily. 30 tablet 3  . sucralfate (CARAFATE) 1 g tablet Take 1 tablet (1 g total) by mouth 4 (four) times  daily -  with meals and at bedtime. 30 tablet 0   No current facility-administered medications on file prior to visit.    BP (!) 130/100 (BP Location: Left Arm, Patient Position: Sitting, Cuff Size: Normal)   Pulse 99   Temp 98.6 F (37 C) (Oral)   Ht 5\' 9"  (1.753 m)   Wt 179 lb 9.6 oz (81.5 kg)   BMI 26.52 kg/m       Objective:   Physical Exam Vitals and nursing note reviewed.  Constitutional:      Appearance: Normal appearance.  Musculoskeletal:        General: Tenderness present. Normal range of motion.     Comments: He continues to have tenderness with palpation to midline spine lower spine and right groin.  Skin:    General: Skin is warm and dry.     Capillary Refill: Capillary refill takes less than 2 seconds.  Neurological:     General: No focal deficit present.     Mental Status: He is alert and oriented to person, place, and time.  Psychiatric:        Mood and Affect: Mood normal.        Behavior: Behavior normal.        Thought Content: Thought content normal.        Judgment: Judgment normal.       Assessment & Plan:  Will get plain films of lumbar spine and right hip today.  He was advised to start taking his prednisone and follow-up with orthopedics on Tuesday.  Work note given to be out of work until he is seen by orthopedics. Can continue with NSAIDS as needed   , NP

## 2019-11-01 ENCOUNTER — Ambulatory Visit: Payer: BC Managed Care – PPO | Admitting: Family Medicine

## 2019-11-01 ENCOUNTER — Ambulatory Visit (INDEPENDENT_AMBULATORY_CARE_PROVIDER_SITE_OTHER): Payer: BC Managed Care – PPO | Admitting: Orthopaedic Surgery

## 2019-11-01 ENCOUNTER — Encounter: Payer: Self-pay | Admitting: Orthopaedic Surgery

## 2019-11-01 ENCOUNTER — Other Ambulatory Visit: Payer: Self-pay

## 2019-11-01 VITALS — BP 134/89 | HR 89 | Ht 69.0 in | Wt 180.0 lb

## 2019-11-01 DIAGNOSIS — M5416 Radiculopathy, lumbar region: Secondary | ICD-10-CM | POA: Diagnosis not present

## 2019-11-01 DIAGNOSIS — R1031 Right lower quadrant pain: Secondary | ICD-10-CM

## 2019-11-01 NOTE — Progress Notes (Signed)
Office Visit Note   Patient: David Barker           Date of Birth: 1975-05-02           MRN: [id number] Visit Date: 11/01/2019              Requested by: Deeann Saint, MD [address] [place],  [place] [address] PCP: Shirline Frees, NP   Assessment & Plan: Visit Diagnoses:  1. Right lower quadrant pain   2. Right groin pain   3. Radiculopathy, lumbar region     Plan: Regards to patient's low back pain and right lower extremity radiculopathy advised that since he has already been started on prednisone taper by his primary care provider that we will give this a little more time.  I will have him follow with me in 1 week for recheck.  If he continues to be symptomatic there I may consider repeating lumbar MRI and comparing to the study that was done in 2019.  I also did advise patient that the right lower quadrant/inguinal pain that he is describing I do not feel this is not related to his lumbar spine.  He has point tenderness over these areas on exam today with some guarding.  My assistant was able to get him an appointment at PCP office with Dr. Clent Ridges this afternoon at 4 PM.  They can make decision as to whether or not further imaging studies are indicated.  I did not add on any new medications today.  Follow-Up Instructions: Return in about 1 week (around 11/08/2019) for with [facility] recheck lumbar radiculopathy.   Orders:  No orders of the defined types were placed in this encounter.  No orders of the defined types were placed in this encounter.     Procedures: No procedures performed   Clinical Data: No additional findings.   Subjective: Chief Complaint  Patient presents with  . Lower Back - Pain    HPI 44 year old black male comes in today with complaints of low back pain and right lower extremity radiculopathy to his thigh.  Patient was last seen in our office for similar complaint in 2019.  He had lumbar MRI scan November 02, 2017 and that  showed:  IMPRESSION: 1. Lumbar disc degeneration at L4-L5 and L5-S1. Low level degenerative endplate marrow edema at L4-L5 with small superimposed Schmorl's nodes. 2. The symptomatic level with regard to pain radiating to the right is probably L5-S1 where a small right paracentral disc protrusion is associated with a small 6 mm sequestered disc fragment in proximity to the descending right S1 nerve roots in the lateral recess. No significant spinal or foraminal stenosis. 3. At L4-L5 a small left paracentral disc protrusion is superimposed on disc bulging with up to moderate left lateral recess stenosis at the left L5 nerve level. No significant spinal or foraminal stenosis.   Electronically Signed   By: Odessa Fleming M.D.   On: 11/02/2017 09:11  He had lumbar ESI with Dr. Alvester Morin November 23, 2017 and was last seen by Dr. Ophelia Charter December 02, 2017.  Patient states that his back has been doing well except for off and on aches and pains here and there when he does a lot of lifting with his job.  States that a couple weeks ago this got worse with right-sided low back pain with pain into his right thigh.  Nothing going below his knee.  No complaints of lower extremity numbness tingling.  Did state that if he  stands for a while is right leg buckled on him.  No left leg symptoms.  he states that this problem has been complicated with right groin pain where he localizes to around the right lower quadrant and right inguinal area.  No complaints of nausea vomiting.  His back and groin pain worse when he is lifting and active.  PCP nurse practitioner [name] ordered lumbar spine and hip x-rays October 27, 2019.  Those report showed:  EXAM: LUMBAR SPINE - COMPLETE 4+ VIEW  COMPARISON:  Lumbar spine radiographs 10/22/2017  FINDINGS: Normal alignment. Vertebral body heights are maintained. Moderate intervertebral disc space loss at L4-5 and L5-S1 similar to prior. No new focal bone lesion. SI  joints are open and symmetric. Nonobstructive bowel gas pattern.  IMPRESSION: No acute osseous abnormality. Moderate degenerative disc disease at L4-5 and L5-S1, similar to prior.   Electronically Signed   By: Emmaline Kluver M.D.   On: 10/27/2019 10:32   EXAM: DG HIP (WITH OR WITHOUT PELVIS) 2-3V RIGHT  COMPARISON:  None.  FINDINGS: There is no evidence of hip fracture or dislocation. There is no evidence of arthropathy or other focal bone abnormality.  IMPRESSION: Negative.   Electronically Signed   By: Lupita Raider M.D.   On: 10/27/2019 15:05   Review of Systems No current complaints of fever chills pulmonary cardiac issues.  Objective: Vital Signs: BP 134/89   Pulse 89   Ht 5\' 9"  (1.753 m)   Wt 180 lb (81.6 kg)   BMI 26.58 kg/m   Physical Exam Constitutional:      Appearance: He is not toxic-appearing.  HENT:     Head: Normocephalic.  Eyes:     Extraocular Movements: Extraocular movements intact.     Pupils: Pupils are equal, round, and reactive to light.  Pulmonary:     Effort: Pulmonary effort is normal. No respiratory distress.  Abdominal:     Palpations: There is no mass.     Tenderness: There is abdominal tenderness (Moderate right lower quadrant/inguinal). There is guarding.  Musculoskeletal:     Comments: Mild right lower lumbar paraspinal tenderness.  Bilateral sciatic notch nontender.  Lumbar extension causes some discomfort in the right lower quadrant groin area.  Negative logroll bilateral hips.  Negative straight leg raise.    Other areas on his abdomen unremarkable.  Neurovascular intact.  No focal motor deficits.  Neurological:     General: No focal deficit present.     Mental Status: He is alert and oriented to person, place, and time.     Ortho Exam  Specialty Comments:  No specialty comments available.  Imaging: No results found.   PMFS History: Patient Active Problem List   Diagnosis Date Noted  . Lumbar disc  herniation with radiculopathy 12/02/2017  . Lip swelling 08/16/2015   Past Medical History:  Diagnosis Date  . Back pain   . Pulmonary embolism (HCC) 2006    Family History  Problem Relation Age of Onset  . Hypertension Paternal Grandmother   . Hypertension Mother   . Diabetes Maternal Uncle   . Diabetes Paternal Uncle     History reviewed. No pertinent surgical history. Social History   Occupational History  . Not on file  Tobacco Use  . Smoking status: Current Some Day Smoker    Packs/day: 0.20    Years: 3.00    Pack years: 0.60    Types: Cigarettes  . Smokeless tobacco: Never Used  Substance and Sexual Activity  .  Alcohol use: Yes    Alcohol/week: 0.0 standard drinks    Comment: occasional  . Drug use: No  . Sexual activity: Never    Birth control/protection: Abstinence

## 2019-11-03 ENCOUNTER — Ambulatory Visit: Payer: BC Managed Care – PPO | Admitting: Surgery

## 2019-11-09 ENCOUNTER — Ambulatory Visit (INDEPENDENT_AMBULATORY_CARE_PROVIDER_SITE_OTHER): Payer: BC Managed Care – PPO | Admitting: Adult Health

## 2019-11-09 ENCOUNTER — Other Ambulatory Visit: Payer: Self-pay

## 2019-11-09 ENCOUNTER — Encounter: Payer: Self-pay | Admitting: Adult Health

## 2019-11-09 VITALS — BP 140/90 | HR 8 | Temp 98.4°F | Ht 69.0 in | Wt 179.0 lb

## 2019-11-09 DIAGNOSIS — R1031 Right lower quadrant pain: Secondary | ICD-10-CM

## 2019-11-09 NOTE — Progress Notes (Signed)
Subjective:    Patient ID: David Barker, male    DOB: 1975/09/18, 44 y.o.   MRN: [id number]  HPI 44 year old male who  has a past medical history of Back pain and Pulmonary embolism (HCC) (2006).   He presents to the office today for follow-up regarding right sided groin pain.  He was seen on 11/01/2019 by orthopedics due to lumbar disc herniation with radiculopathy.  When he was seen he was taking prednisone that was prescribed by this writer for his low back pain.  During the office visit at orthopedics they did not feel as though his right lower quadrant/inguinal pain was related to his lumbar spine and he was advised to follow-up with his PCP.  Today he reports that he is no longer having any right sided inguinal/abdominal pain but continues to have discomfort in his right lower back.  This has improved after the prednisone therapy.  Review of Systems See HPI   Past Medical History:  Diagnosis Date  . Back pain   . Pulmonary embolism (HCC) 2006    Social History   Socioeconomic History  . Marital status: Single    Spouse name: Not on file  . Number of children: Not on file  . Years of education: Not on file  . Highest education level: Not on file  Occupational History  . Not on file  Tobacco Use  . Smoking status: Current Some Day Smoker    Packs/day: 0.20    Years: 3.00    Pack years: 0.60    Types: Cigarettes  . Smokeless tobacco: Never Used  Substance and Sexual Activity  . Alcohol use: Yes    Alcohol/week: 0.0 standard drinks    Comment: occasional  . Drug use: No  . Sexual activity: Never    Birth control/protection: Abstinence  Other Topics Concern  . Not on file  Social History Narrative   Married    Two children    Social Determinants of Health   Financial Resource Strain:   . Difficulty of Paying Living Expenses: Not on file  Food Insecurity:   . Worried About Programme researcher, broadcasting/film/video in the Last Year: Not on file  . Ran Out of Food in the Last Year:  Not on file  Transportation Needs:   . Lack of Transportation (Medical): Not on file  . Lack of Transportation (Non-Medical): Not on file  Physical Activity:   . Days of Exercise per Week: Not on file  . Minutes of Exercise per Session: Not on file  Stress:   . Feeling of Stress : Not on file  Social Connections:   . Frequency of Communication with Friends and Family: Not on file  . Frequency of Social Gatherings with Friends and Family: Not on file  . Attends Religious Services: Not on file  . Active Member of Clubs or Organizations: Not on file  . Attends Banker Meetings: Not on file  . Marital Status: Not on file  Intimate Partner Violence:   . Fear of Current or Ex-Partner: Not on file  . Emotionally Abused: Not on file  . Physically Abused: Not on file  . Sexually Abused: Not on file    History reviewed. No pertinent surgical history.  Family History  Problem Relation Age of Onset  . Hypertension Paternal Grandmother   . Hypertension Mother   . Diabetes Maternal Uncle   . Diabetes Paternal Uncle     No Known Allergies  Current Outpatient Medications  on File Prior to Visit  Medication Sig Dispense Refill  . chlordiazePOXIDE (LIBRIUM) 25 MG capsule 50mg  PO TID x 1D, then 25-50mg  PO BID X 1D, then 25-50mg  PO QD X 1D (Patient not taking: Reported on 11/09/2019) 10 capsule 0  . orphenadrine (NORFLEX) 100 MG tablet  (Patient not taking: Reported on 11/09/2019)    . pantoprazole (PROTONIX) 40 MG tablet Take 1 tablet (40 mg total) by mouth daily. (Patient not taking: Reported on 11/09/2019) 30 tablet 3  . sucralfate (CARAFATE) 1 g tablet Take 1 tablet (1 g total) by mouth 4 (four) times daily -  with meals and at bedtime. (Patient not taking: Reported on 11/09/2019) 30 tablet 0   No current facility-administered medications on file prior to visit.    BP 140/90 (BP Location: Left Arm, Patient Position: Sitting, Cuff Size: Normal)   Pulse (!) 8   Temp 98.4 F  (36.9 C) (Oral)   Ht 5\' 9"  (1.753 m)   Wt 179 lb (81.2 kg)   SpO2 96%   BMI 26.43 kg/m       Objective:   Physical Exam Vitals and nursing note reviewed.  Constitutional:      Appearance: Normal appearance.  Cardiovascular:     Rate and Rhythm: Normal rate and regular rhythm.     Pulses: Normal pulses.     Heart sounds: Normal heart sounds.  Pulmonary:     Effort: Pulmonary effort is normal.     Breath sounds: Normal breath sounds.  Musculoskeletal:        General: Tenderness (Right lower back) present. Normal range of motion.     Comments: No discomfort in his right lower abdomen/inguinal area.  Has full range of motion with right hip without discomfort to right inguinal area.  Did have some right lower back pain with straight leg raise and knee-to-chest.  Skin:    General: Skin is warm and dry.  Neurological:     General: No focal deficit present.     Mental Status: He is alert and oriented to person, place, and time.  Psychiatric:        Mood and Affect: Mood normal.        Behavior: Behavior normal.        Thought Content: Thought content normal.        Judgment: Judgment normal.       Assessment & Plan:  1. Right inguinal pain -Pain has resolved.  Do not see a need to do any further imaging at this time.  Was advised to follow-up with orthopedics, sounds like he has an appointment tomorrow for injection?.  He can follow-up at this office at any time especially if his symptoms return.  11/11/2019, NP

## 2019-11-10 ENCOUNTER — Ambulatory Visit (INDEPENDENT_AMBULATORY_CARE_PROVIDER_SITE_OTHER): Payer: BC Managed Care – PPO | Admitting: Surgery

## 2019-11-10 ENCOUNTER — Encounter: Payer: Self-pay | Admitting: Surgery

## 2019-11-10 VITALS — Ht 69.0 in | Wt 179.0 lb

## 2019-11-10 DIAGNOSIS — M5416 Radiculopathy, lumbar region: Secondary | ICD-10-CM

## 2019-11-10 IMAGING — US US ABDOMEN COMPLETE
1 series · 14 of 25 positions shown · non-contrast
Comparison: None.

CLINICAL DATA: Pain

EXAM:
ABDOMEN ULTRASOUND COMPLETE

[Series 1: us abdomen complete · 14 of 130 slices shown]
[im 1/130]
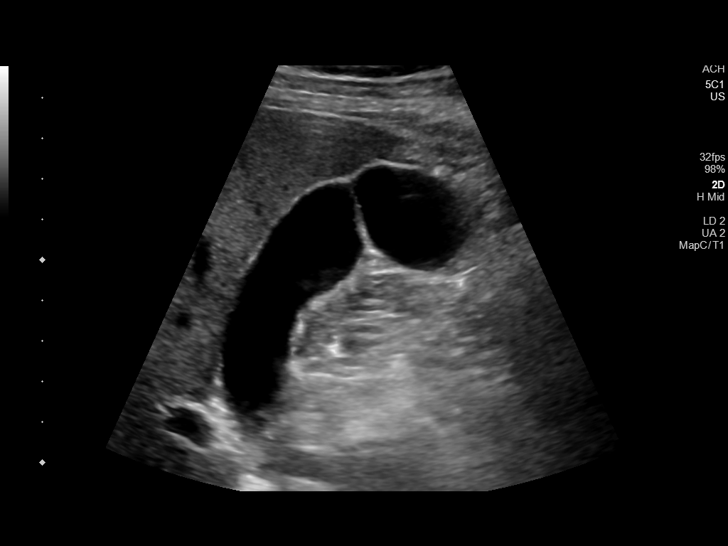
[im 11/130]
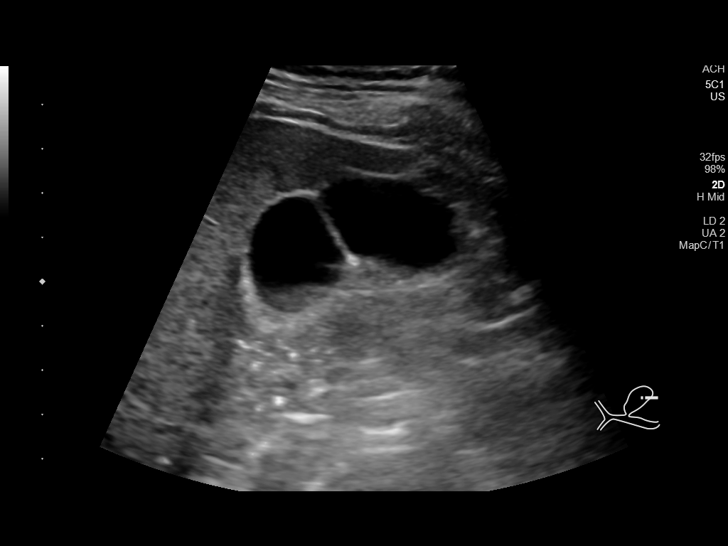
[im 22/130]
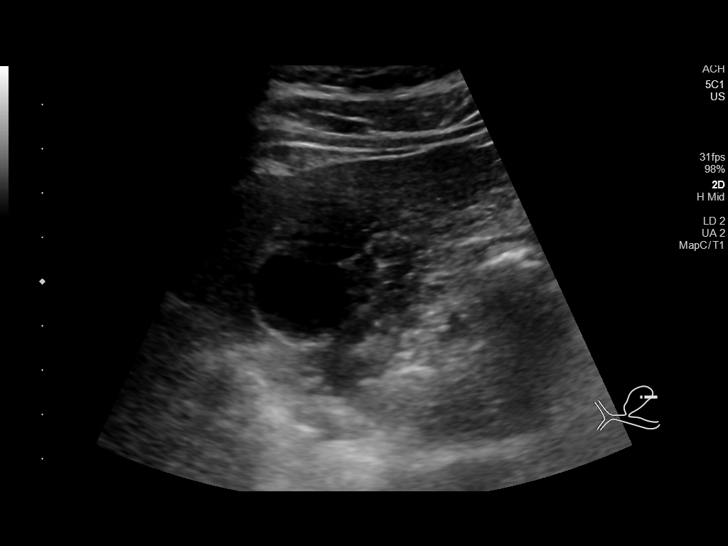
[im 33/130]
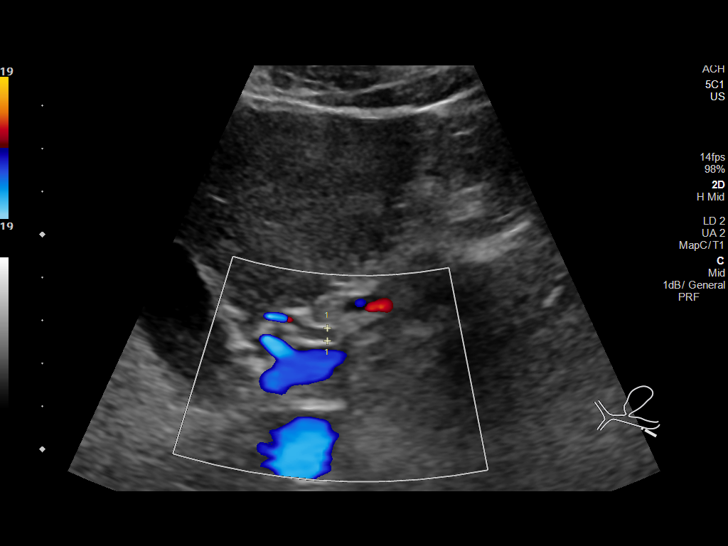
[im 44/130]
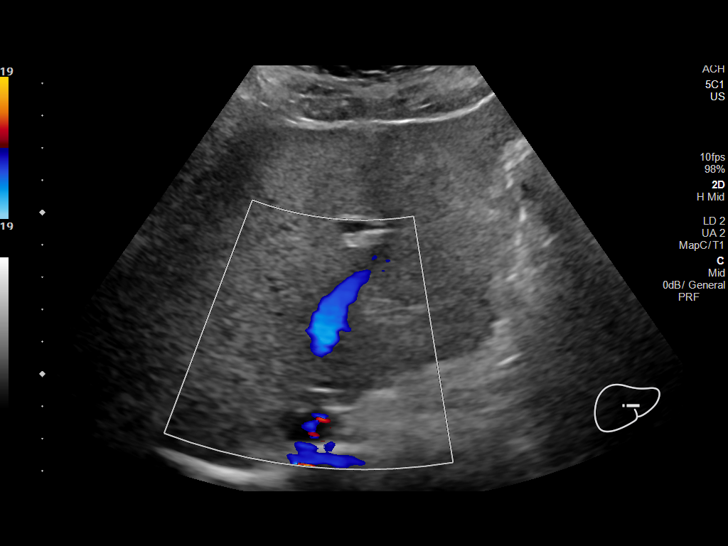
[im 49/130]
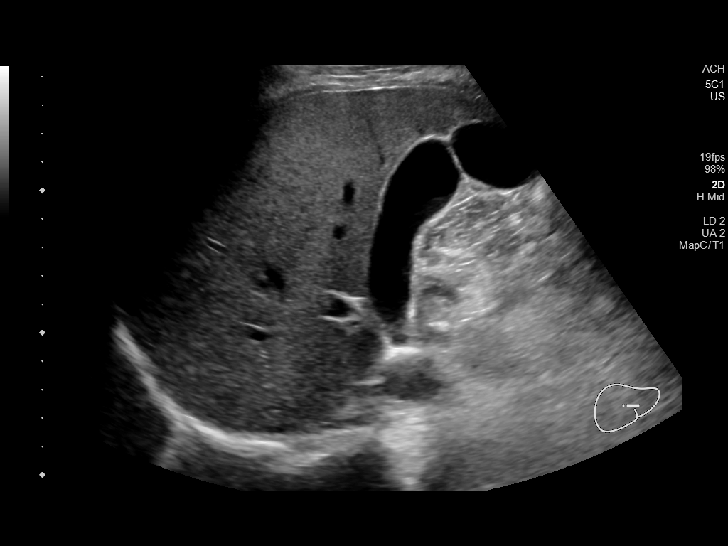
[im 60/130]
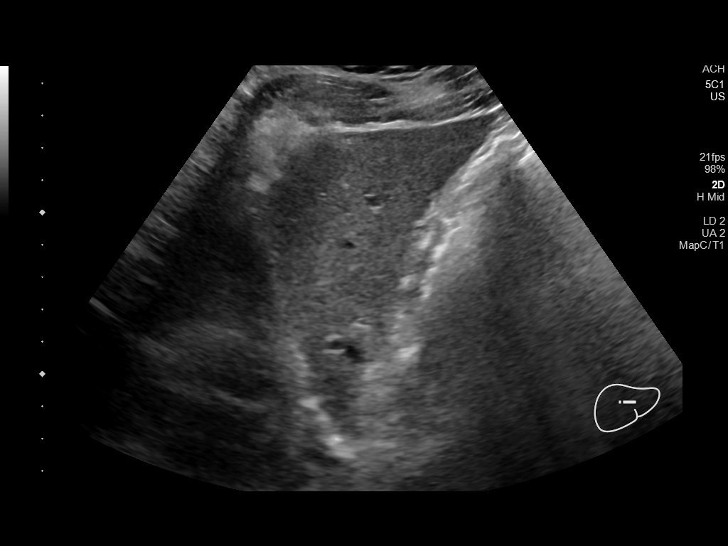
[im 70/130]
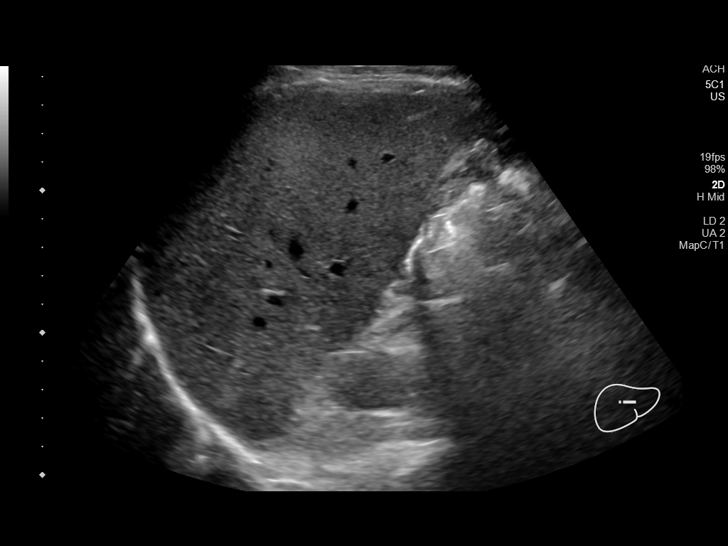
[im 81/130]
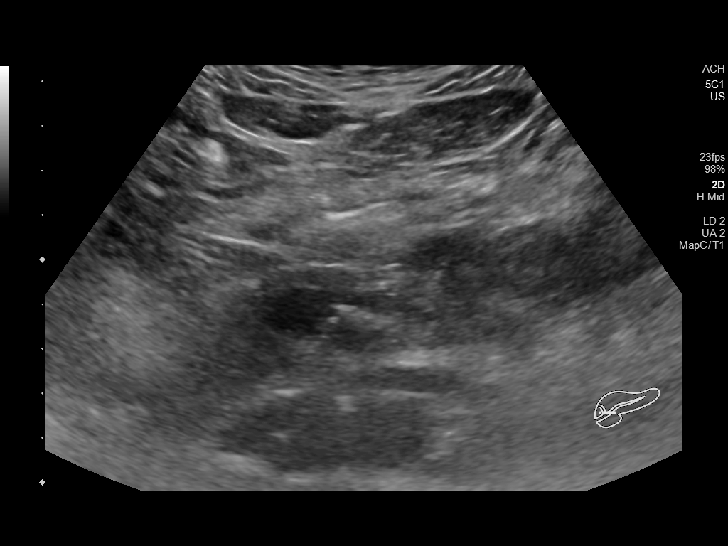
[im 87/130]
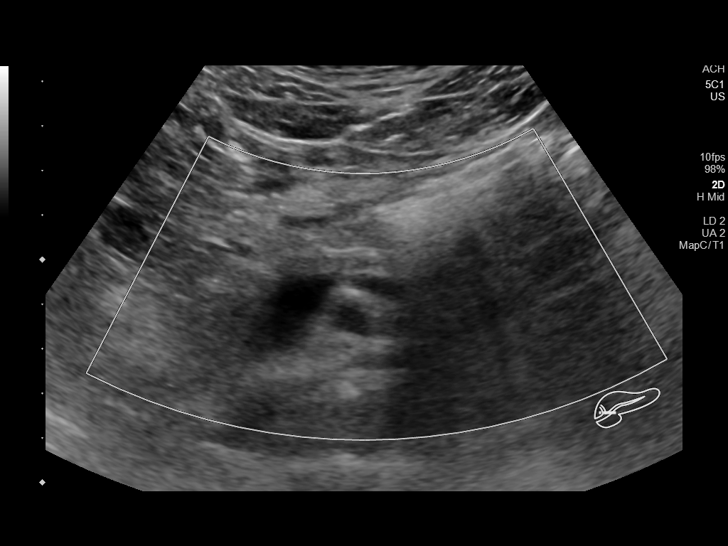
[im 97/130]
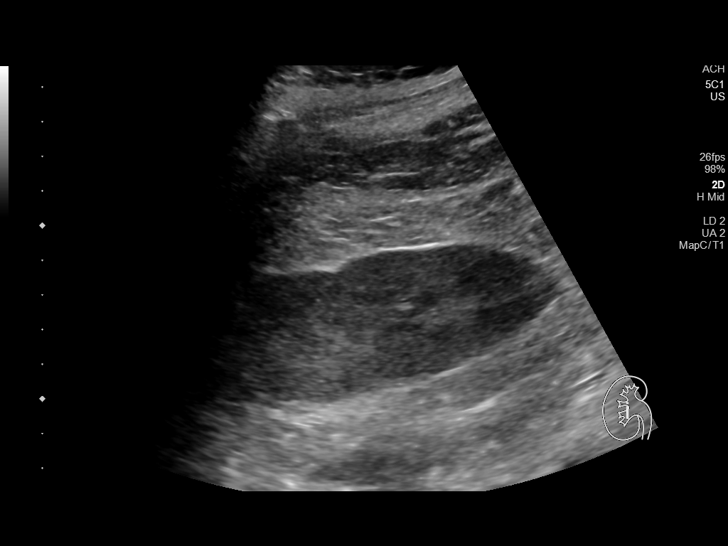
[im 108/130]
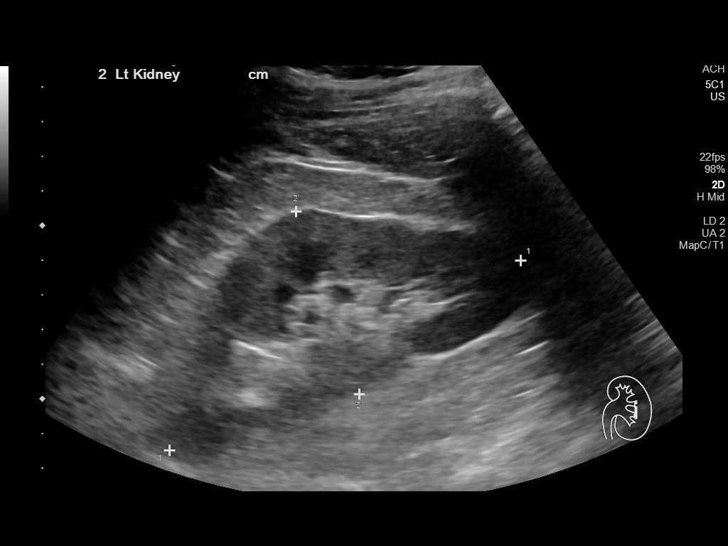
[im 119/130]
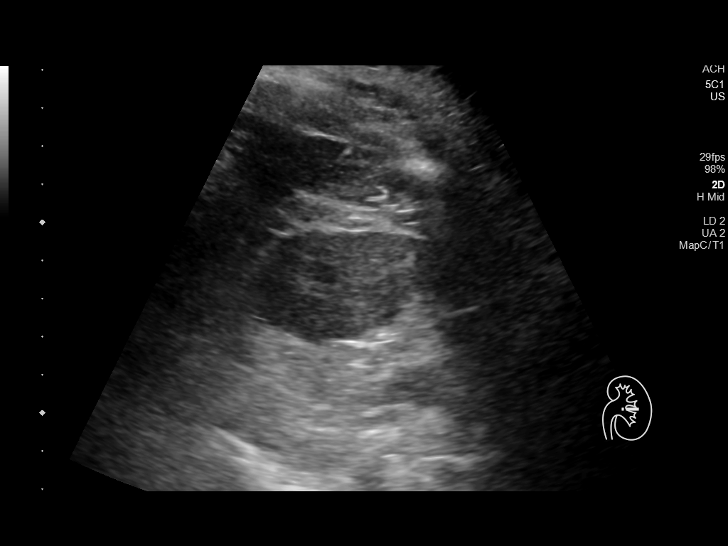
[im 130/130]
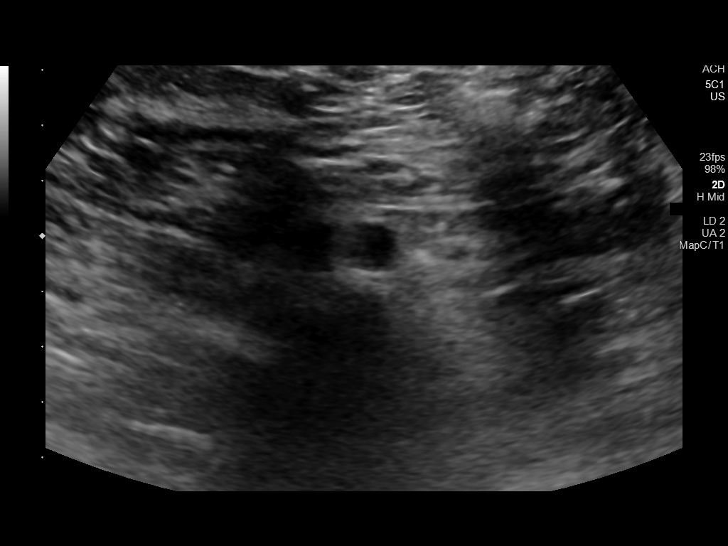

[14 of 25 positions shown; findings below may reference images not displayed]

FINDINGS: Gallbladder: No gallstones or wall thickening visualized. No
sonographic Murphy sign noted by sonographer.

Common bile duct: Diameter: 5 mm

Liver: No focal lesion identified. Within normal limits in
parenchymal echogenicity. Portal vein is patent on color Doppler
imaging with normal direction of blood flow towards the liver.

IVC: No abnormality visualized.

Pancreas: The pancreas is only partially visualized secondary to
poor sonographic windows. The visualized portions are unremarkable.

Spleen: Size and appearance within normal limits.

Right Kidney: Length: 11.4 cm. Echogenicity within normal limits. No
mass or hydronephrosis visualized.

Left Kidney: Length: 11.5 cm. Echogenicity within normal limits. No
mass or hydronephrosis visualized.

Abdominal aorta: No aneurysm visualized.

Other findings: None.
IMPRESSION: Normal study.

## 2019-11-10 NOTE — Progress Notes (Signed)
44 year old black male returns for recheck of his low back pain and right lower extremity radiculopathy.  Last office visit with me patient was also complaining of right inguinal/groin pain and I had referred him back to his primary care office for evaluation of this.  Patient states that he was seen by nurse practitioner [name] yesterday.  Patient states that he was examined by nurse practitioner.  Note from that visit stated that the pain had resolved and there were no need for further imaging studies.  Patient also reports to me that he no longer has the inguinal/groin pain and that an x-ray had been done of the area in question.  I advised patient that if there is a soft tissue problem in that area that an x-ray would not pick this up.  Exam Gait is somewhat antalgic.  Positive right straight leg raise.  Neurovascular intact.  No focal motor deficits.   Plan  I will order lumbar MRI to rule out HNP/stenosis.  Follow-up with Dr. Ophelia Charter in 2 weeks for recheck to discuss results and further treatment options.  Patient was given a note taken him out of work for 2 more weeks and then Dr. Ophelia Charter can  discuss return to work status at that time.

## 2019-11-17 ENCOUNTER — Telehealth: Payer: Self-pay | Admitting: Radiology

## 2019-11-17 NOTE — Telephone Encounter (Signed)
Patient requests work note that takes him out of work until seen in the office for MRI review on 11/25/2019. Per Fayrene Fearing last office note, patient was to be out until follow up after MRI, however, it listed 2 weeks. Patient would like to pick this up at the office on 11/22/2019 when he goes for his MRI appt.

## 2019-11-18 NOTE — Telephone Encounter (Signed)
FYI.  You had taken patient out of work x 2 weeks when he would follow up in the office for MRI review. MRI is scheduled for 11/22/2019 with follow up in the office on 11/25/2019. I entered note that patient would be out of work until 11/25/2019 when he will review MRI in the office.

## 2019-11-22 ENCOUNTER — Other Ambulatory Visit: Payer: Self-pay

## 2019-11-22 ENCOUNTER — Ambulatory Visit
Admission: RE | Admit: 2019-11-22 | Discharge: 2019-11-22 | Disposition: A | Discharge status: Home / Self Care | Payer: BC Managed Care – PPO | Source: Ambulatory Visit | Attending: Surgery | Admitting: Surgery

## 2019-11-22 ENCOUNTER — Ambulatory Visit: Payer: BC Managed Care – PPO | Admitting: Orthopaedic Surgery

## 2019-11-22 DIAGNOSIS — M5416 Radiculopathy, lumbar region: Secondary | ICD-10-CM

## 2019-11-25 ENCOUNTER — Ambulatory Visit (INDEPENDENT_AMBULATORY_CARE_PROVIDER_SITE_OTHER): Payer: BC Managed Care – PPO | Admitting: Orthopaedic Surgery

## 2019-11-25 ENCOUNTER — Other Ambulatory Visit: Payer: Self-pay

## 2019-11-25 ENCOUNTER — Encounter: Payer: Self-pay | Admitting: Orthopaedic Surgery

## 2019-11-25 VITALS — Ht 69.5 in | Wt 175.0 lb

## 2019-11-25 DIAGNOSIS — M5416 Radiculopathy, lumbar region: Secondary | ICD-10-CM | POA: Diagnosis not present

## 2019-11-25 NOTE — Progress Notes (Signed)
Office Visit Note   Patient: David Barker           Date of Birth: 1976/01/15           MRN: [id number] Visit Date: 11/25/2019              Requested by: Shirline Frees, NP [address] [place],  [place] [address] PCP: Shirline Frees, NP   Assessment & Plan: Visit Diagnoses: No diagnosis found.  Plan: lumbar ESI . ROV after ESI. Work note , OOW until office visit after MRI.  Patient got really good relief back in 2019 for an epidural.  We will repeat epidural.  Office follow-up after epidural.  Follow-Up Instructions: Follow-up after epidural.  Orders:  No orders of the defined types were placed in this encounter.  No orders of the defined types were placed in this encounter.     Procedures: No procedures performed   Clinical Data: No additional findings.   Subjective: Chief Complaint  Patient presents with  . Lower Back - Follow-up, Pain    MRI lumbar review    HPI 44 year old patient returns with similar symptoms with last visit.  Patient's been doing minimal activity.  Increased pain going out to eat and increased pain and soreness with walking.  He has a lumbar MRI scan showed some disc degeneration.  There is right paracentral L5-S1 and left at L4-5.  The disc at L4-5 has improved with less protrusion but there is still some lateral recess narrowing.  At L5-S1 the small right paracentral disc protrusion and extruded fragment is also improved.  Review of Systems 14 point systems update unchanged other than as mentioned in HPI.   Objective: Vital Signs: Ht 5' 9.5" (1.765 m)   Wt 175 lb (79.4 kg)   BMI 25.47 kg/m   Physical Exam Constitutional:      Appearance: He is well-developed.  HENT:     Head: Normocephalic and atraumatic.  Eyes:     Pupils: Pupils are equal, round, and reactive to light.  Neck:     Thyroid: No thyromegaly.     Trachea: No tracheal deviation.  Cardiovascular:     Rate and Rhythm: Normal rate.  Pulmonary:     Effort:  Pulmonary effort is normal.     Breath sounds: No wheezing.  Abdominal:     General: Bowel sounds are normal.     Palpations: Abdomen is soft.  Skin:    General: Skin is warm and dry.     Capillary Refill: Capillary refill takes less than 2 seconds.  Neurological:     Mental Status: He is alert and oriented to person, place, and time.  Psychiatric:        Behavior: Behavior normal.        Thought Content: Thought content normal.        Judgment: Judgment normal.     Ortho Exam negative straight leg raising some sciatic notch tenderness no lumbar tenderness.  Patient is able to heel and toe walk.  Distal pulses are 2+.  Negative logroll's right and left.  Specialty Comments:  No specialty comments available.  Imaging:  L3-L4: Mild disc desiccation height loss. Small broad-based disc bulge without significant canal or foraminal stenosis. Similar to prior.  L4-L5: Left eccentric broad-based disc bulge. Previously seen left paracentral disc protrusion has improved. Mild left greater than right facet hypertrophy. Persistent moderate left subarticular recess stenosis at this level. No significant central canal or foraminal stenosis.  L5-S1: Disc  desiccation height loss. Small broad-based disc bulge with improved small right paracentral disc protrusion and extruded fragment. No significant central canal stenosis. Similar mild bilateral facet hypertrophy without convincing foraminal stenosis.  IMPRESSION: 1. At L4-L5 the previously described left paracentral disc protrusion has improved with persistent left eccentric broad-based disc bulge. There is persistent moderate left subarticular recess stenosis at this level. 2. At L5-S1 the previously described small right paracentral disc protrusion and extruded fragment has improved. 3. Overall, no significant central canal or foraminal stenosis.   Electronically Signed   By: Feliberto Harts MD   On: 11/22/2019  12:49   PMFS History: Patient Active Problem List   Diagnosis Date Noted  . 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 Lip swelling 08/16/2015   Past Medical History:  Diagnosis Date  . Back pain   . Pulmonary embolism (HCC) 2006    Family History  Problem Relation Age of Onset  . Hypertension Paternal Grandmother   . Hypertension Mother   . Diabetes Maternal Uncle   . Diabetes Paternal Uncle     No past surgical history on file. Social History   Occupational History  . Not on file  Tobacco Use  . Smoking status: Current Some Day Smoker    Packs/day: 0.20    Years: 3.00    Pack years: 0.60    Types: Cigarettes  . Smokeless tobacco: Never Used  Substance and Sexual Activity  . Alcohol use: Yes    Alcohol/week: 0.0 standard drinks    Comment: occasional  . Drug use: No  . Sexual activity: Never    Birth control/protection: Abstinence

## 2019-11-28 ENCOUNTER — Telehealth: Payer: Self-pay

## 2019-11-28 NOTE — Telephone Encounter (Signed)
Note has been entered and is available for patient to pick up.

## 2019-11-28 NOTE — Telephone Encounter (Signed)
Patient would like a letter/note stating when his appointment is scheduled for Dr. Alvester Morin to provide to his insurance.  Cb# [phone]. Please advise.  Thank you.

## 2019-11-29 ENCOUNTER — Other Ambulatory Visit: Payer: BC Managed Care – PPO

## 2019-11-29 NOTE — Telephone Encounter (Signed)
I left voicemail for patient advising note is at front desk for pick up.

## 2019-12-27 ENCOUNTER — Ambulatory Visit (INDEPENDENT_AMBULATORY_CARE_PROVIDER_SITE_OTHER): Payer: BC Managed Care – PPO | Admitting: Physical Medicine and Rehabilitation

## 2019-12-27 ENCOUNTER — Other Ambulatory Visit: Payer: Self-pay

## 2019-12-27 ENCOUNTER — Ambulatory Visit: Payer: Self-pay

## 2019-12-27 ENCOUNTER — Encounter: Payer: Self-pay | Admitting: Physical Medicine and Rehabilitation

## 2019-12-27 VITALS — BP 129/87 | HR 84

## 2019-12-27 DIAGNOSIS — M5116 Intervertebral disc disorders with radiculopathy, lumbar region: Secondary | ICD-10-CM | POA: Diagnosis not present

## 2019-12-27 DIAGNOSIS — M5416 Radiculopathy, lumbar region: Secondary | ICD-10-CM | POA: Diagnosis not present

## 2019-12-27 MED ORDER — METHYLPREDNISOLONE ACETATE 80 MG/ML IJ SUSP
80.0000 mg | Freq: Once | INTRAMUSCULAR | Status: AC
  Administered 2019-12-27: 80 mg

## 2019-12-27 NOTE — Progress Notes (Signed)
Pt state lowerback pain that travel down to his right buttocks and hip. Pt statw walking, standing and sitting for long period of time makes the pain worse. Pt state it hard to find a comfortable position to fall to sleep. Pt state he take pain meds to help ease his pain.  Numeric Pain Rating Scale and Functional Assessment Average Pain 8   In the last MONTH (on 0-10 scale) has pain interfered with the following?  1. General activity like being  able to carry out your everyday physical activities such as walking, climbing stairs, carrying groceries, or moving a chair?  Rating(10)   +Driver, -BT, -Dye Allergies.

## 2020-01-17 ENCOUNTER — Ambulatory Visit (INDEPENDENT_AMBULATORY_CARE_PROVIDER_SITE_OTHER): Payer: BC Managed Care – PPO | Admitting: Orthopaedic Surgery

## 2020-01-17 ENCOUNTER — Encounter: Payer: Self-pay | Admitting: Orthopaedic Surgery

## 2020-01-17 ENCOUNTER — Other Ambulatory Visit: Payer: Self-pay

## 2020-01-17 VITALS — BP 132/82 | HR 85 | Ht 69.5 in | Wt 175.0 lb

## 2020-01-17 DIAGNOSIS — M5116 Intervertebral disc disorders with radiculopathy, lumbar region: Secondary | ICD-10-CM | POA: Diagnosis not present

## 2020-01-17 NOTE — Progress Notes (Signed)
Office Visit Note   Patient: David Barker           Date of Birth: April 03, 1975           MRN: [id number] Visit Date: 01/17/2020              Requested by: Shirline Frees, NP [address] [place],  [place] [address] PCP: Shirline Frees, NP   Assessment & Plan: Visit Diagnoses:  1. Lumbar disc herniation with radiculopathy     Plan: Work slip given for work resumption on 01/31/2020 regular work without restrictions.  We reviewed his MRI scans again today discuss the physiology of the condition discussed repetitive bending lifting activities that he does aggravating his lumbar disc degeneration.  He is worked there for more than 10 years.  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  He is happy that he is gotten some improvement with epidural and I will check him again on an as-needed basis.  Follow-Up Instructions: No follow-ups on file.   Orders:  No orders of the defined types were placed in this encounter.  No orders of the defined types were placed in this encounter.     Procedures: No procedures performed   Clinical Data: No additional findings.   Subjective: Chief Complaint  Patient presents with  . Lower Back - Pain, Follow-up    HPI 44 year old male with lumbar disc degeneration returns for follow-up post epidural on 12/27/2019.  Patient states that the injection has given him some pain relief.  Patient job is at a [employer] involves a lot of turning twisting lifting activities.  He has degenerative changes at L4-5 and L5-S1.  Patient had right paracentral disc at L5S1.  Some improvement of the disc protrusion at L4-5 on serial MRI scan.  He states he is ready to resume work after the holidays.  Review of Systems 14 point systems updated unchanged.   Objective: Vital Signs: BP 132/82   Pulse 85   Ht 5' 9.5" (1.765 m)   Wt 175 lb (79.4 kg)   BMI 25.47 kg/m   Physical Exam Constitutional:       Appearance: He is well-developed and well-nourished.  HENT:     Head: Normocephalic and atraumatic.  Eyes:     Extraocular Movements: EOM normal.     Pupils: Pupils are equal, round, and reactive to light.  Neck:     Thyroid: No thyromegaly.     Trachea: No tracheal deviation.  Cardiovascular:     Rate and Rhythm: Normal rate.  Pulmonary:     Effort: Pulmonary effort is normal.     Breath sounds: No wheezing.  Abdominal:     General: Bowel sounds are normal.     Palpations: Abdomen is soft.  Skin:    General: Skin is warm and dry.     Capillary Refill: Capillary refill takes less than 2 seconds.  Neurological:     Mental Status: He is alert and oriented to person, place, and time.  Psychiatric:        Mood and Affect: Mood and affect normal.        Behavior: Behavior normal.        Thought Content: Thought content normal.        Judgment: Judgment normal.     Ortho Exam patient is able to heel and toe walk negative straight leg raising.  Distal pulses are intact anterior tib gastrocsoleus  is intact.  Specialty Comments:  No specialty comments available.  Imaging: No results found.   PMFS History: Patient Active Problem List   Diagnosis Date Noted  . 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 Lip swelling 08/16/2015   Past Medical History:  Diagnosis Date  . Back pain   . Pulmonary embolism (HCC) 2006    Family History  Problem Relation Age of Onset  . Hypertension Paternal Grandmother   . Hypertension Mother   . Diabetes Maternal Uncle   . Diabetes Paternal Uncle     No past surgical history on file. Social History   Occupational History  . Not on file  Tobacco Use  . Smoking status: Current Some Day Smoker    Packs/day: 0.20    Years: 3.00    Pack years: 0.60    Types: Cigarettes  . Smokeless tobacco: Never Used  Substance and Sexual Activity  . Alcohol use: Yes    Alcohol/week: 0.0 standard drinks    Comment: occasional  .  Drug use: No  . Sexual activity: Never    Birth control/protection: Abstinence

## 2020-02-16 ENCOUNTER — Telehealth: Payer: Self-pay | Admitting: Orthopaedic Surgery

## 2020-02-16 NOTE — Telephone Encounter (Signed)
[employer] forms received. Sent to [employer] 

## 2020-04-23 NOTE — Progress Notes (Signed)
David Barker - 45 y.o. male MRN [id number]  Date of birth: [birth year]  Office Visit Note: Visit Date: 12/27/2019 PCP: Shirline Frees, NP Referred by: Shirline Frees, NP  Subjective: Chief Complaint  Patient presents with  . Lower Back - Pain  . Right Hip - Pain   HPI:  David Barker is a 45 y.o. male who comes in today at the request of Dr. Annell Greening for planned Right S1-2 Lumbar epidural steroid injection with fluoroscopic guidance.  The patient has failed conservative care including home exercise, medications, time and activity modification.  This injection will be diagnostic and hopefully therapeutic.  Please see requesting physician notes for further details and justification.   ROS Otherwise per HPI.  Assessment & Plan: Visit Diagnoses:    ICD-10-CM   1. Lumbar radiculopathy  M54.16 XR C-ARM NO REPORT    Epidural Steroid injection    methylPREDNISolone acetate (DEPO-MEDROL) injection 80 mg  2. Radiculopathy due to lumbar intervertebral disc disorder  M51.16 XR C-ARM NO REPORT    Epidural Steroid injection    methylPREDNISolone acetate (DEPO-MEDROL) injection 80 mg    Plan: No additional findings.   Meds & Orders:  Meds ordered this encounter  Medications  . methylPREDNISolone acetate (DEPO-MEDROL) injection 80 mg    Orders Placed This Encounter  Procedures  . XR C-ARM NO REPORT  . Epidural Steroid injection    Follow-up: Return for visit to requesting physician as needed.   Procedures: No procedures performed  S1 Lumbosacral Transforaminal Epidural Steroid Injection - Sub-Pedicular Approach with Fluoroscopic Guidance   Patient: David Barker      Date of Birth: 1975-12-10 MRN: [id number] PCP: Shirline Frees, NP      Visit Date: 12/27/2019   Universal Protocol:    Date/Time: 04/04/225:13 AM  Consent Given By: the patient  Position:  PRONE  Additional Comments: Vital signs were monitored before and after the procedure. Patient was prepped and draped in the  usual sterile fashion. The correct patient, procedure, and site was verified.   Injection Procedure Details:  Procedure Site One Meds Administered:  Meds ordered this encounter  Medications  . methylPREDNISolone acetate (DEPO-MEDROL) injection 80 mg    Laterality: Right  Location/Site:  S1 Foramen   Needle size: 22 ga.  Needle type: Spinal  Needle Placement: Transforaminal  Findings:   -Comments: Excellent flow of contrast along the nerve, nerve root and into the epidural space.  Epidurogram: Contrast epidurogram showed no nerve root cut off or restricted flow pattern.  Procedure Details: After squaring off the sacral end-plate to get a true AP view, the C-arm was positioned so that the best possible view of the S1 foramen was visualized. The soft tissues overlying this structure were infiltrated with 2-3 ml. of 1% Lidocaine without Epinephrine.    The spinal needle was inserted toward the target using a "trajectory" view along the fluoroscope beam.  Under AP and lateral visualization, the needle was advanced so it did not puncture dura. Biplanar projections were used to confirm position. Aspiration was confirmed to be negative for CSF and/or blood. A 1-2 ml. volume of Isovue-250 was injected and flow of contrast was noted at each level. Radiographs were obtained for documentation purposes.   After attaining the desired flow of contrast documented above, a 0.5 to 1.0 ml test dose of 0.25% Marcaine was injected into each respective transforaminal space.  The patient was observed for 90 seconds post injection.  After no sensory deficits were reported, and normal lower  extremity motor function was noted,   the above injectate was administered so that equal amounts of the injectate were placed at each foramen (level) into the transforaminal epidural space.   Additional Comments:  The patient tolerated the procedure well Dressing: Band-Aid with 2 x 2 sterile gauze     Post-procedure details: Patient was observed during the procedure. Post-procedure instructions were reviewed.  Patient left the clinic in stable condition.     Clinical History: MRI LUMBAR SPINE WITHOUT CONTRAST  TECHNIQUE: Multiplanar, multisequence MR imaging of the lumbar spine was performed. No intravenous contrast was administered.  COMPARISON:  Lumbar radiographs 10/22/2017 and earlier.  FINDINGS: Segmentation:  Normal on the comparison radiographs.  Alignment: Improved lumbar lordosis from the recent radiographs. No spondylolisthesis.  Vertebrae: Low level degenerative appearing endplate marrow edema at L4-L5. There are small superimposed endplate Schmorl's nodes at that level (series 4, image 8). See additional disc findings below.  Elsewhere bone marrow signal is mildly heterogeneous but within normal limits. No other marrow edema or acute osseous abnormality.  Conus medullaris and cauda equina: Conus extends to the T12-L1 level. No lower spinal cord or conus signal abnormality.  Paraspinal and other soft tissues: Negative.  Disc levels:  T11-T12: Negative.  T12-L1:  Negative.  L1-L2:  Negative.  L2-L3:  Negative.  L3-L4: Mild disc desiccation and disc space loss. Mild circumferential disc bulge and endplate spurring. No stenosis.  L4-L5: Disc desiccation and disc space loss. Circumferential disc bulge. Superimposed left paracentral disc protrusion (series 5, image 31) and also small endplate Schmorl's nodes. Borderline to mild facet and ligament flavum hypertrophy. Mild to moderate left lateral recess stenosis (left L5 nerve level) without spinal stenosis. No convincing foraminal stenosis.  L5-S1: Disc desiccation and disc space loss with small right paracentral disc protrusion but also an adjacent small cephalad extruded disc fragment. The fragment measures about 6 millimeters and is most apparent on series 2, image 8. See also  series 6, image 36. It is in proximity to the descending S1 nerve roots in the lateral recesses, more so the right. No significant spinal stenosis. Mild facet hypertrophy, but no convincing foraminal stenosis.  IMPRESSION: 1. Lumbar disc degeneration at L4-L5 and L5-S1. Low level degenerative endplate marrow edema at L4-L5 with small superimposed Schmorl's nodes. 2. The symptomatic level with regard to pain radiating to the right is probably L5-S1 where a small right paracentral disc protrusion is associated with a small 6 mm sequestered disc fragment in proximity to the descending right S1 nerve roots in the lateral recess. No significant spinal or foraminal stenosis. 3. At L4-L5 a small left paracentral disc protrusion is superimposed on disc bulging with up to moderate left lateral recess stenosis at the left L5 nerve level. No significant spinal or foraminal stenosis.   Electronically Signed   By: Odessa Fleming M.D.   On: 11/02/2017 09:11     Objective:  VS:  HT:    WT:   BMI:     BP:129/87  HR:84bpm  TEMP: ( )  RESP:  Physical Exam Vitals and nursing note reviewed.  Constitutional:      General: He is not in acute distress.    Appearance: Normal appearance. He is not ill-appearing.  HENT:     Head: Normocephalic and atraumatic.     Right Ear: External ear normal.     Left Ear: External ear normal.     Nose: No congestion.  Eyes:     Extraocular Movements: Extraocular movements intact.  Cardiovascular:     Rate and Rhythm: Normal rate.     Pulses: Normal pulses.  Pulmonary:     Effort: Pulmonary effort is normal. No respiratory distress.  Abdominal:     General: There is no distension.     Palpations: Abdomen is soft.  Musculoskeletal:        General: No tenderness or signs of injury.     Cervical back: Neck supple.     Right lower leg: No edema.     Left lower leg: No edema.     Comments: Patient has good distal strength without clonus.  Skin:     Findings: No erythema or rash.  Neurological:     General: No focal deficit present.     Mental Status: He is alert and oriented to person, place, and time.     Sensory: No sensory deficit.     Motor: No weakness or abnormal muscle tone.     Coordination: Coordination normal.  Psychiatric:        Mood and Affect: Mood normal.        Behavior: Behavior normal.      Imaging: No results found.

## 2020-04-23 NOTE — Procedures (Signed)
S1 Lumbosacral Transforaminal Epidural Steroid Injection - Sub-Pedicular Approach with Fluoroscopic Guidance   Patient: David Barker      Date of Birth: [birth year] MRN: [id number] PCP: Shirline Frees, NP      Visit Date: 12/27/2019   Universal Protocol:    Date/Time: 04/04/225:13 AM  Consent Given By: the patient  Position:  PRONE  Additional Comments: Vital signs were monitored before and after the procedure. Patient was prepped and draped in the usual sterile fashion. The correct patient, procedure, and site was verified.   Injection Procedure Details:  Procedure Site One Meds Administered:  Meds ordered this encounter  Medications  . methylPREDNISolone acetate (DEPO-MEDROL) injection 80 mg    Laterality: Right  Location/Site:  S1 Foramen   Needle size: 22 ga.  Needle type: Spinal  Needle Placement: Transforaminal  Findings:   -Comments: Excellent flow of contrast along the nerve, nerve root and into the epidural space.  Epidurogram: Contrast epidurogram showed no nerve root cut off or restricted flow pattern.  Procedure Details: After squaring off the sacral end-plate to get a true AP view, the C-arm was positioned so that the best possible view of the S1 foramen was visualized. The soft tissues overlying this structure were infiltrated with 2-3 ml. of 1% Lidocaine without Epinephrine.    The spinal needle was inserted toward the target using a "trajectory" view along the fluoroscope beam.  Under AP and lateral visualization, the needle was advanced so it did not puncture dura. Biplanar projections were used to confirm position. Aspiration was confirmed to be negative for CSF and/or blood. A 1-2 ml. volume of Isovue-250 was injected and flow of contrast was noted at each level. Radiographs were obtained for documentation purposes.   After attaining the desired flow of contrast documented above, a 0.5 to 1.0 ml test dose of 0.25% Marcaine was injected into each  respective transforaminal space.  The patient was observed for 90 seconds post injection.  After no sensory deficits were reported, and normal lower extremity motor function was noted,   the above injectate was administered so that equal amounts of the injectate were placed at each foramen (level) into the transforaminal epidural space.   Additional Comments:  The patient tolerated the procedure well Dressing: Band-Aid with 2 x 2 sterile gauze    Post-procedure details: Patient was observed during the procedure. Post-procedure instructions were reviewed.  Patient left the clinic in stable condition.

## 2020-09-10 ENCOUNTER — Ambulatory Visit: Payer: Self-pay

## 2020-09-10 ENCOUNTER — Other Ambulatory Visit: Payer: Self-pay

## 2020-09-10 ENCOUNTER — Other Ambulatory Visit: Payer: Self-pay | Admitting: Occupational Medicine

## 2020-09-10 DIAGNOSIS — Z0000 Encounter for general adult medical examination without abnormal findings: Secondary | ICD-10-CM

## 2021-01-03 ENCOUNTER — Ambulatory Visit (INDEPENDENT_AMBULATORY_CARE_PROVIDER_SITE_OTHER): Payer: BC Managed Care – PPO | Admitting: Adult Health

## 2021-01-03 ENCOUNTER — Other Ambulatory Visit (HOSPITAL_COMMUNITY)
Admission: RE | Admit: 2021-01-03 | Discharge: 2021-01-03 | Disposition: A | Discharge status: Home / Self Care | Payer: BC Managed Care – PPO | Source: Ambulatory Visit | Attending: Adult Health | Admitting: Adult Health

## 2021-01-03 ENCOUNTER — Encounter: Payer: Self-pay | Admitting: Adult Health

## 2021-01-03 VITALS — BP 130/98 | HR 70 | Temp 98.5°F | Ht 69.5 in | Wt 179.0 lb

## 2021-01-03 DIAGNOSIS — M5116 Intervertebral disc disorders with radiculopathy, lumbar region: Secondary | ICD-10-CM | POA: Diagnosis not present

## 2021-01-03 DIAGNOSIS — Z113 Encounter for screening for infections with a predominantly sexual mode of transmission: Secondary | ICD-10-CM | POA: Diagnosis not present

## 2021-01-03 DIAGNOSIS — Z720 Tobacco use: Secondary | ICD-10-CM | POA: Diagnosis not present

## 2021-01-03 LAB — URINALYSIS
Bilirubin Urine: NEGATIVE
Hgb urine dipstick: NEGATIVE
Ketones, ur: NEGATIVE
Leukocytes,Ua: NEGATIVE
Nitrite: NEGATIVE
Specific Gravity, Urine: 1.015 (ref 1.000–1.030)
Total Protein, Urine: NEGATIVE
Urine Glucose: NEGATIVE
Urobilinogen, UA: 0.2 (ref 0.0–1.0)
pH: 6 (ref 5.0–8.0)

## 2021-01-03 MED ORDER — METHYLPREDNISOLONE 4 MG PO TBPK: ORAL_TABLET | ORAL | 0 refills | Status: DC

## 2021-01-03 MED ORDER — VARENICLINE TARTRATE 0.5 MG X 11 & 1 MG X 42 PO TBPK: ORAL_TABLET | ORAL | 0 refills | Status: DC

## 2021-01-03 MED ORDER — VARENICLINE TARTRATE 1 MG PO TABS: 1.0000 mg | ORAL_TABLET | Freq: Two times a day (BID) | ORAL | 2 refills | Status: AC

## 2021-01-03 NOTE — Progress Notes (Signed)
Subjective:    Patient ID: David Barker, male    DOB: 1975-12-27, 45 y.o.   MRN: [id number]  HPI 45 year old male who  has a past medical history of Back pain and Pulmonary embolism (HCC) (2006).  He presents to the office today for multiple issues.  's issue is that of chronic lower lumbar pain.  He was last seen by Dr. Ophelia Charter back in December 2021.  In the past he has bonded well to a Medrol Dosepak as well as epidural injections.  His last MRI revealed degenerative changes at L4-L5 and L5-S1.  He had a right paracentral disc at L5-S1.  Some improvement of the disc protrusion at L4-5.  He has been pretty pain-free over the last year until a couple weeks ago when the pain returned.  He would also like to have some help with quitting smoking.  He is ready to quit and understands that this will be the best decision he can make for his health.  Currently smoking roughly 1/2 a pack to a full pack a day.  Additionally, he would like to have STD screening done.  Had unprotected sex with a new partner a few weeks ago.  He denies any symptoms.  Review of Systems See HPI   Past Medical History:  Diagnosis Date   Back pain    Pulmonary embolism (HCC) 2006    Social History   Socioeconomic History   Marital status: Single    Spouse name: Not on file   Number of children: Not on file   Years of education: Not on file   Highest education level: Not on file  Occupational History   Not on file  Tobacco Use   Smoking status: Some Days    Packs/day: 0.20    Years: 3.00    Pack years: 0.60    Types: Cigarettes   Smokeless tobacco: Never  Substance and Sexual Activity   Alcohol use: Yes    Alcohol/week: 0.0 standard drinks    Comment: occasional   Drug use: No   Sexual activity: Never    Birth control/protection: Abstinence  Other Topics Concern   Not on file  Social History Narrative   Married    Two children    Social Determinants of Health   Financial Resource Strain: Not on  file  Food Insecurity: Not on file  Transportation Needs: Not on file  Physical Activity: Not on file  Stress: Not on file  Social Connections: Not on file  Intimate Partner Violence: Not on file    History reviewed. No pertinent surgical history.  Family History  Problem Relation Age of Onset   Hypertension Paternal Grandmother    Hypertension Mother    Diabetes Maternal Uncle    Diabetes Paternal Uncle     No Known Allergies  Current Outpatient Medications on File Prior to Visit  Medication Sig Dispense Refill   chlordiazePOXIDE (LIBRIUM) 25 MG capsule 50mg  PO TID x 1D, then 25-50mg  PO BID X 1D, then 25-50mg  PO QD X 1D 10 capsule 0   orphenadrine (NORFLEX) 100 MG tablet      pantoprazole (PROTONIX) 40 MG tablet Take 1 tablet (40 mg total) by mouth daily. 30 tablet 3   sucralfate (CARAFATE) 1 g tablet Take 1 tablet (1 g total) by mouth 4 (four) times daily -  with meals and at bedtime. 30 tablet 0   No current facility-administered medications on file prior to visit.    BP (!) 130/98  Pulse 70    Temp 98.5 F (36.9 C) (Oral)    Ht 5' 9.5" (1.765 m)    Wt 179 lb (81.2 kg)    SpO2 95%    BMI 26.05 kg/m       Objective:   Physical Exam Vitals and nursing note reviewed.  Constitutional:      Appearance: Normal appearance.  Cardiovascular:     Rate and Rhythm: Normal rate and regular rhythm.     Pulses: Normal pulses.     Heart sounds: Normal heart sounds.  Pulmonary:     Effort: Pulmonary effort is normal.     Breath sounds: Normal breath sounds.  Musculoskeletal:        General: Normal range of motion.  Neurological:     General: No focal deficit present.     Mental Status: He is alert and oriented to person, place, and time.  Psychiatric:        Mood and Affect: Mood normal.        Behavior: Behavior normal.        Thought Content: Thought content normal.        Judgment: Judgment normal.      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  - methylPREDNISolone (MEDROL DOSEPAK) 4 MG TBPK tablet; Take as directed  Dispense: 21 tablet; Refill: 0 - Follow up with orthopedics if no improvement   2. Screening for STD (sexually transmitted disease)  - Urinalysis; Future - Urine cytology ancillary only; Future - HIV Antibody (routine testing w rflx); Future - RPR; Future - RPR - HIV Antibody (routine testing w rflx) - Urine cytology ancillary only - Urinalysis  3. Tobacco use  - varenicline (CHANTIX PAK) 0.5 MG X 11 & 1 MG X 42 tablet; Take one 0.5 mg tablet by mouth once daily for 3 days, then increase to one 0.5 mg tablet twice daily for 4 days, then increase to one 1 mg tablet twice daily.  Dispense: 53 tablet; Refill: 0 - varenicline (CHANTIX) 1 MG tablet; Take 1 tablet (1 mg total) by mouth 2 (two) times daily.  Dispense: 60 tablet; Refill: 2   Shirline Frees, NP

## 2021-01-04 LAB — URINE CYTOLOGY ANCILLARY ONLY
Chlamydia: NEGATIVE
Comment: NEGATIVE
Comment: NEGATIVE
Comment: NORMAL
Neisseria Gonorrhea: NEGATIVE
Trichomonas: NEGATIVE

## 2021-01-04 LAB — HIV ANTIBODY (ROUTINE TESTING W REFLEX): HIV 1&2 Ab, 4th Generation: NONREACTIVE

## 2021-01-04 LAB — RPR: RPR Ser Ql: NONREACTIVE

## 2021-01-08 DIAGNOSIS — Z0279 Encounter for issue of other medical certificate: Secondary | ICD-10-CM

## 2021-01-24 ENCOUNTER — Ambulatory Visit (INDEPENDENT_AMBULATORY_CARE_PROVIDER_SITE_OTHER): Payer: BC Managed Care – PPO | Admitting: Adult Health

## 2021-01-24 ENCOUNTER — Encounter: Payer: Self-pay | Admitting: Adult Health

## 2021-01-24 VITALS — BP 122/80 | HR 80 | Temp 98.7°F | Ht 68.75 in | Wt 176.0 lb

## 2021-01-24 DIAGNOSIS — Z720 Tobacco use: Secondary | ICD-10-CM

## 2021-01-24 DIAGNOSIS — Z0000 Encounter for general adult medical examination without abnormal findings: Secondary | ICD-10-CM | POA: Diagnosis not present

## 2021-01-24 NOTE — Progress Notes (Signed)
Subjective:    Patient ID: David Barker, male    DOB: 01-Nov-1975, 46 y.o.   MRN: [id number]  HPI Patient presents for yearly preventative medicine examination. She is a pleasant 46 year old male who  has a past medical history of Back pain and Pulmonary embolism (HCC) (2006).  Tobacco Use - was prescribed Chantix about three weeks ago. He has the medication but has not started them yet. He plans on starting them.   All immunizations and health maintenance protocols were reviewed with the patient and needed orders were placed.  Appropriate screening laboratory values were ordered for the patient including screening of hyperlipidemia, renal function and hepatic function.  Medication reconciliation,  past medical history, social history, problem list and allergies were reviewed in detail with the patient  Goals were established with regard to weight loss, exercise, and  diet in compliance with medications. He stays active and tries to eat healthy.   Wt Readings from Last 3 Encounters:  01/24/21 176 lb (79.8 kg)  01/03/21 179 lb (81.2 kg)  01/17/20 175 lb (79.4 kg)    Review of Systems  Constitutional: Negative.   HENT: Negative.    Eyes: Negative.   Respiratory: Negative.    Cardiovascular: Negative.   Gastrointestinal: Negative.   Endocrine: Negative.   Genitourinary: Negative.   Musculoskeletal: Negative.   Skin: Negative.   Allergic/Immunologic: Negative.   Neurological: Negative.   Hematological: Negative.   Psychiatric/Behavioral: Negative.    All other systems reviewed and are negative.  Past Medical History:  Diagnosis Date   Back pain    Pulmonary embolism (HCC) 2006    Social History   Socioeconomic History   Marital status: Single    Spouse name: Not on file   Number of children: Not on file   Years of education: Not on file   Highest education level: Not on file  Occupational History   Not on file  Tobacco Use   Smoking status: Some Days    Packs/day:  0.20    Years: 3.00    Pack years: 0.60    Types: Cigarettes   Smokeless tobacco: Never  Substance and Sexual Activity   Alcohol use: Yes    Alcohol/week: 0.0 standard drinks    Comment: occasional   Drug use: No   Sexual activity: Never    Birth control/protection: Abstinence  Other Topics Concern   Not on file  Social History Narrative   Married    Two children    Social Determinants of Health   Financial Resource Strain: Not on file  Food Insecurity: Not on file  Transportation Needs: Not on file  Physical Activity: Not on file  Stress: Not on file  Social Connections: Not on file  Intimate Partner Violence: Not on file    History reviewed. No pertinent surgical history.  Family History  Problem Relation Age of Onset   Hypertension Paternal Grandmother    Hypertension Mother    Diabetes Maternal Uncle    Diabetes Paternal Uncle     No Known Allergies  Current Outpatient Medications on File Prior to Visit  Medication Sig Dispense Refill   varenicline (CHANTIX PAK) 0.5 MG X 11 & 1 MG X 42 tablet Take one 0.5 mg tablet by mouth once daily for 3 days, then increase to one 0.5 mg tablet twice daily for 4 days, then increase to one 1 mg tablet twice daily. 53 tablet 0   varenicline (CHANTIX) 1 MG tablet Take 1 tablet (  1 mg total) by mouth 2 (two) times daily. 60 tablet 2   No current facility-administered medications on file prior to visit.    BP 122/80    Pulse 80    Temp 98.7 F (37.1 C) (Oral)    Ht 5' 8.75" (1.746 m)    Wt 176 lb (79.8 kg)    SpO2 95%    BMI 26.18 kg/m        Objective:   Physical Exam Vitals and nursing note reviewed.  Constitutional:      General: He is not in acute distress.    Appearance: Normal appearance. He is well-developed and normal weight.  HENT:     Head: Normocephalic and atraumatic.     Right Ear: Tympanic membrane, ear canal and external ear normal. There is no impacted cerumen.     Left Ear: Tympanic membrane, ear  canal and external ear normal. There is no impacted cerumen.     Nose: Nose normal. No congestion or rhinorrhea.     Mouth/Throat:     Mouth: Mucous membranes are moist.     Pharynx: Oropharynx is clear. No oropharyngeal exudate or posterior oropharyngeal erythema.  Eyes:     General:        Right eye: No discharge.        Left eye: No discharge.     Extraocular Movements: Extraocular movements intact.     Conjunctiva/sclera: Conjunctivae normal.     Pupils: Pupils are equal, round, and reactive to light.  Neck:     Vascular: No carotid bruit.     Trachea: No tracheal deviation.  Cardiovascular:     Rate and Rhythm: Normal rate and regular rhythm.     Pulses: Normal pulses.     Heart sounds: Normal heart sounds. No murmur heard.   No friction rub. No gallop.  Pulmonary:     Effort: Pulmonary effort is normal. No respiratory distress.     Breath sounds: Normal breath sounds. No stridor. No wheezing, rhonchi or rales.  Chest:     Chest wall: No tenderness.  Abdominal:     General: Bowel sounds are normal. There is no distension.     Palpations: Abdomen is soft. There is no mass.     Tenderness: There is no abdominal tenderness. There is no right CVA tenderness, left CVA tenderness, guarding or rebound.     Hernia: No hernia is present.  Musculoskeletal:        General: No swelling, tenderness, deformity or signs of injury. Normal range of motion.     Right lower leg: No edema.     Left lower leg: No edema.  Lymphadenopathy:     Cervical: No cervical adenopathy.  Skin:    General: Skin is warm and dry.     Capillary Refill: Capillary refill takes less than 2 seconds.     Coloration: Skin is not jaundiced or pale.     Findings: No bruising, erythema, lesion or rash.  Neurological:     General: No focal deficit present.     Mental Status: He is alert and oriented to person, place, and time.     Cranial Nerves: No cranial nerve deficit.     Sensory: No sensory deficit.      Motor: No weakness.     Coordination: Coordination normal.     Gait: Gait normal.     Deep Tendon Reflexes: Reflexes normal.  Psychiatric:        Mood and Affect: Mood  normal.        Behavior: Behavior normal.        Thought Content: Thought content normal.        Judgment: Judgment normal.      Assessment & Plan:  1. Routine general medical examination at a health care facility - Continue to stay active and eat healthy  - Follow up in one year or sooner if needed - CBC with Differential/Platelet; Future - Comprehensive metabolic panel; Future - Hemoglobin A1c; Future - Lipid panel; Future - TSH; Future  2. Tobacco use - encouraged to start using Chantix   Shirline Frees, NP

## 2021-02-05 ENCOUNTER — Other Ambulatory Visit (INDEPENDENT_AMBULATORY_CARE_PROVIDER_SITE_OTHER): Payer: BC Managed Care – PPO

## 2021-02-05 DIAGNOSIS — Z0000 Encounter for general adult medical examination without abnormal findings: Secondary | ICD-10-CM

## 2021-02-05 LAB — LIPID PANEL
Cholesterol: 211 mg/dL — ABNORMAL HIGH (ref 0–200)
HDL: 63.8 mg/dL (ref >39.00)
LDL Cholesterol: 115 mg/dL — ABNORMAL HIGH (ref 0–99)
NonHDL: 147.63
Total CHOL/HDL Ratio: 3
Triglycerides: 161 mg/dL — ABNORMAL HIGH (ref 0.0–149.0)
VLDL: 32.2 mg/dL (ref 0.0–40.0)

## 2021-02-05 LAB — COMPREHENSIVE METABOLIC PANEL
ALT: 38 U/L (ref 0–53)
AST: 31 U/L (ref 0–37)
Albumin: 4.6 g/dL (ref 3.5–5.2)
Alkaline Phosphatase: 83 U/L (ref 39–117)
BUN: 11 mg/dL (ref 6–23)
CO2: 29 mEq/L (ref 19–32)
Calcium: 9.6 mg/dL (ref 8.4–10.5)
Chloride: 101 mEq/L (ref 96–112)
Creatinine, Ser: 0.92 mg/dL (ref 0.40–1.50)
GFR: 101.26 mL/min (ref >60.00)
Glucose, Bld: 99 mg/dL (ref 70–99)
Potassium: 4.6 mEq/L (ref 3.5–5.1)
Sodium: 137 mEq/L (ref 135–145)
Total Bilirubin: 1.2 mg/dL (ref 0.2–1.2)
Total Protein: 7.3 g/dL (ref 6.0–8.3)

## 2021-02-05 LAB — CBC WITH DIFFERENTIAL/PLATELET
Basophils Absolute: 0 10*3/uL (ref 0.0–0.1)
Basophils Relative: 0.5 % (ref 0.0–3.0)
Eosinophils Absolute: 0.1 10*3/uL (ref 0.0–0.7)
Eosinophils Relative: 2.2 % (ref 0.0–5.0)
HCT: 47.9 % (ref 39.0–52.0)
Hemoglobin: 15.9 g/dL (ref 13.0–17.0)
Lymphocytes Relative: 41.8 % (ref 12.0–46.0)
Lymphs Abs: 2.1 10*3/uL (ref 0.7–4.0)
MCHC: 33.3 g/dL (ref 30.0–36.0)
MCV: 97 fl (ref 78.0–100.0)
Monocytes Absolute: 0.5 10*3/uL (ref 0.1–1.0)
Monocytes Relative: 9.3 % (ref 3.0–12.0)
Neutro Abs: 2.3 10*3/uL (ref 1.4–7.7)
Neutrophils Relative %: 46.2 % (ref 43.0–77.0)
Platelets: 262 10*3/uL (ref 150.0–400.0)
RBC: 4.94 Mil/uL (ref 4.22–5.81)
RDW: 14.7 % (ref 11.5–15.5)
WBC: 5 10*3/uL (ref 4.0–10.5)

## 2021-02-05 LAB — HEMOGLOBIN A1C: Hgb A1c MFr Bld: 5.8 % (ref 4.6–6.5)

## 2021-02-05 LAB — TSH: TSH: 1.38 u[IU]/mL (ref 0.35–5.50)

## 2021-05-10 ENCOUNTER — Encounter: Payer: Self-pay | Admitting: Adult Health

## 2021-05-10 ENCOUNTER — Ambulatory Visit (INDEPENDENT_AMBULATORY_CARE_PROVIDER_SITE_OTHER): Payer: BC Managed Care – PPO | Admitting: Adult Health

## 2021-05-10 ENCOUNTER — Ambulatory Visit: Payer: BC Managed Care – PPO | Admitting: Adult Health

## 2021-05-10 VITALS — BP 128/98 | HR 79 | Temp 98.0°F | Ht 68.75 in | Wt 176.0 lb

## 2021-05-10 DIAGNOSIS — B078 Other viral warts: Secondary | ICD-10-CM

## 2021-05-10 DIAGNOSIS — M5441 Lumbago with sciatica, right side: Secondary | ICD-10-CM | POA: Diagnosis not present

## 2021-05-10 MED ORDER — METHYLPREDNISOLONE 4 MG PO TBPK: ORAL_TABLET | ORAL | 0 refills | Status: DC

## 2021-05-10 MED ORDER — METHYLPREDNISOLONE ACETATE 80 MG/ML IJ SUSP
80.0000 mg | Freq: Once | INTRAMUSCULAR | Status: AC
  Administered 2021-05-10: 80 mg via INTRAMUSCULAR

## 2021-05-10 MED ORDER — KETOROLAC TROMETHAMINE 60 MG/2ML IM SOLN
60.0000 mg | Freq: Once | INTRAMUSCULAR | Status: AC
  Administered 2021-05-10: 60 mg via INTRAMUSCULAR

## 2021-05-10 NOTE — Progress Notes (Signed)
? ?Subjective:  ? ? Patient ID: Heidi Lemay, male    DOB: January 06, 1976, 46 y.o.   MRN: [id number] ? ?HPI ?46 year old male who  has a past medical history of Back pain and Pulmonary embolism (HCC) (2006). ? ?He has known history of lumbar disk herniation with radiculopathy. Reports that over the last two weeks he has been experiencing "the worse it has ever been". Has low back pain with radiating pain to his right buttock and down his right leg.  He believes that an exacerbating factor was a flight from [facility].  He has been on bedrest most of the time.  Pain is worse with ambulation.  Reports using Tylenol, Advil, and leftover Flexeril without relief.  In the past he has responded well to Medrol Dosepak. ? ?Additionally, he has a skin neoplasm on his mid upper back that is sensitive to touch and he would like it removed. ? ?Review of Systems ?See HPI  ? ?Past Medical History:  ?Diagnosis Date  ? Back pain   ? Pulmonary embolism (HCC) 2006  ? ? ?Social History  ? ?Socioeconomic History  ? Marital status: Single  ?  Spouse name: Not on file  ? Number of children: Not on file  ? Years of education: Not on file  ? Highest education level: Some college, no degree  ?Occupational History  ? Not on file  ?Tobacco Use  ? Smoking status: Some Days  ?  Packs/day: 0.20  ?  Years: 3.00  ?  Pack years: 0.60  ?  Types: Cigarettes  ? Smokeless tobacco: Never  ?Substance and Sexual Activity  ? Alcohol use: Yes  ?  Alcohol/week: 0.0 standard drinks  ?  Comment: occasional  ? Drug use: No  ? Sexual activity: Never  ?  Birth control/protection: Abstinence  ?Other Topics Concern  ? Not on file  ?Social History Narrative  ? Married   ? Two children   ? ?Social Determinants of Health  ? ?Financial Resource Strain: Low Risk   ? Difficulty of Paying Living Expenses: Not hard at all  ?Food Insecurity: No Food Insecurity  ? Worried About Programme researcher, broadcasting/film/video in the Last Year: Never true  ? Ran Out of Food in the Last Year: Never true   ?Transportation Needs: No Transportation Needs  ? Lack of Transportation (Medical): No  ? Lack of Transportation (Non-Medical): No  ?Physical Activity: Insufficiently Active  ? Days of Exercise per Week: 3 days  ? Minutes of Exercise per Session: 40 min  ?Stress: No Stress Concern Present  ? Feeling of Stress : Not at all  ?Social Connections: Moderately Isolated  ? Frequency of Communication with Friends and Family: More than three times a week  ? Frequency of Social Gatherings with Friends and Family: More than three times a week  ? Attends Religious Services: 1 to 4 times per year  ? Active Member of Clubs or Organizations: No  ? Attends Banker Meetings: Not on file  ? Marital Status: Never married  ?Intimate Partner Violence: Not on file  ? ? ?History reviewed. No pertinent surgical history. ? ?Family History  ?Problem Relation Age of Onset  ? Hypertension Paternal Grandmother   ? Hypertension Mother   ? Diabetes Maternal Uncle   ? Diabetes Paternal Uncle   ? ? ?No Known Allergies ? ?Current Outpatient Medications on File Prior to Visit  ?Medication Sig Dispense Refill  ? varenicline (CHANTIX PAK) 0.5 MG X 11 &  1 MG X 42 tablet Take one 0.5 mg tablet by mouth once daily for 3 days, then increase to one 0.5 mg tablet twice daily for 4 days, then increase to one 1 mg tablet twice daily. 53 tablet 0  ? ?No current facility-administered medications on file prior to visit.  ? ? ?BP (!) 128/98   Pulse 79   Temp 98 ?F (36.7 ?C) (Oral)   Ht 5' 8.75" (1.746 m)   Wt 176 lb (79.8 kg)   SpO2 99%   BMI 26.18 kg/m?  ? ? ?   ?Objective:  ? Physical Exam ?Vitals and nursing note reviewed.  ?Constitutional:   ?   General: He is in acute distress.  ?   Appearance: Normal appearance.  ?Musculoskeletal:     ?   General: Tenderness (throughout right sciatic nerve) present. No swelling.  ?   Right lower leg: No edema.  ?   Left lower leg: No edema.  ?Skin: ?   General: Skin is warm and dry.  ?   Capillary Refill:  Capillary refill takes less than 2 seconds.  ? ?    ?Neurological:  ?   General: No focal deficit present.  ?   Mental Status: He is alert and oriented to person, place, and time.  ?   Gait: Gait abnormal (limping gait).  ?Psychiatric:     ?   Mood and Affect: Mood normal.     ?   Behavior: Behavior normal.     ?   Thought Content: Thought content normal.  ? ?   ?Assessment & Plan:  ?1. Acute right-sided low back pain with right-sided sciatica ?- Advised warm compresses and rest. Can continue to use Flexeril and NSAIDS ?- methylPREDNISolone acetate (DEPO-MEDROL) injection 80 mg ?- ketorolac (TORADOL) injection 60 mg ?- methylPREDNISolone (MEDROL DOSEPAK) 4 MG TBPK tablet; Take as directed  Dispense: 21 tablet; Refill: 0 ? ?2. Common wart ?-Discussed various treatments of removal including bathing the wart as well and is liquid nitrogen cryotherapy.  He opted for cryotherapy.  Using 3 freeze thaw cycles this common wart was frozen.  Patient tolerated procedure well.  Follow-up if the wart does not fall off ? ? ?Shirline Frees, NP ? ?Time of total for care on the day of the encounter 30 minutes. This includes time spent in both face-to-face and non-face-to-face activities including preparing for the visit, reviewing the chart, time spent with patient, evaluation and counseling patient/family/caregiver, coordinating care, and time spent documenting in the chart which was performed on the date of service (05/10/2021). Note: this excludes any time spent performing billable procedures or separate charges (such as time spent counseling smoking cessation); these charges are billed separately.  ? ? ?

## 2021-05-10 NOTE — Patient Instructions (Signed)
Health Maintenance Due  ?Topic Date Due  ? COVID-19 Vaccine (3 - Booster) 10/30/2019  ? ? ? ? Row Labels 01/03/2021  ?  8:59 AM 11/09/2019  ? 10:37 AM 08/16/2015  ?  8:15 AM  ?Depression screen PHQ 2/9   Section Header. No data exists in this row.     ?Decreased Interest   2 0 0  ?Down, Depressed, Hopeless   2 0 0  ?PHQ - 2 Score   4 0 0  ?Altered sleeping   2    ?Tired, decreased energy   2    ?Change in appetite   2    ?Feeling bad or failure about yourself    2    ?Trouble concentrating   2    ?Moving slowly or fidgety/restless   2    ?Suicidal thoughts   2    ?PHQ-9 Score   18    ?Difficult doing work/chores   Very difficult    ? ? ?

## 2021-05-21 ENCOUNTER — Ambulatory Visit (INDEPENDENT_AMBULATORY_CARE_PROVIDER_SITE_OTHER): Payer: BC Managed Care – PPO | Admitting: Adult Health

## 2021-05-21 ENCOUNTER — Encounter: Payer: Self-pay | Admitting: Adult Health

## 2021-05-21 VITALS — BP 140/110 | HR 79 | Temp 98.6°F | Ht 68.75 in | Wt 167.0 lb

## 2021-05-21 DIAGNOSIS — M5441 Lumbago with sciatica, right side: Secondary | ICD-10-CM

## 2021-05-21 MED ORDER — TRAMADOL HCL 50 MG PO TABS: 50.0000 mg | ORAL_TABLET | Freq: Three times a day (TID) | ORAL | 0 refills | Status: AC | PRN

## 2021-05-21 MED ORDER — CYCLOBENZAPRINE HCL 10 MG PO TABS: 10.0000 mg | ORAL_TABLET | Freq: Three times a day (TID) | ORAL | 0 refills | Status: DC | PRN

## 2021-05-21 NOTE — Progress Notes (Signed)
? ?Subjective:  ? ? Patient ID: David Barker, male    DOB: 18-Oct-1975, 46 y.o.   MRN: [id number] ? ?HPI ?46 year old male who  has a past medical history of Back pain and Pulmonary embolism (HCC) (2006). ? ?He presents to the office today for follow-up regarding right-sided low back pain with right-sided sciatica.  He does have a history of lumbar disc herniation with radiculopathy.  Has been seen by orthopedics in the past, last being in 2021 and had steroid injections which she responded well to.  He was last seen in this office on 05/10/2021 with acute exacerbation.  He was placed on a Medrol Dosepak which she has responded well to in the past. ? ?Today he reports that his discomfort is less but it continues to be present.  He has numbness and tingling throughout his entire right leg down to his toes.  At times he feels as though the muscles in his leg are spasming.  He has been unable to sleep due to the discomfort. ? ? ? ?Review of Systems ?See HPI  ? ?Past Medical History:  ?Diagnosis Date  ? Back pain   ? Pulmonary embolism (HCC) 2006  ? ? ?Social History  ? ?Socioeconomic History  ? Marital status: Single  ?  Spouse name: Not on file  ? Number of children: Not on file  ? Years of education: Not on file  ? Highest education level: Some college, no degree  ?Occupational History  ? Not on file  ?Tobacco Use  ? Smoking status: Some Days  ?  Packs/day: 0.20  ?  Years: 3.00  ?  Pack years: 0.60  ?  Types: Cigarettes  ? Smokeless tobacco: Never  ?Substance and Sexual Activity  ? Alcohol use: Yes  ?  Alcohol/week: 0.0 standard drinks  ?  Comment: occasional  ? Drug use: No  ? Sexual activity: Never  ?  Birth control/protection: Abstinence  ?Other Topics Concern  ? Not on file  ?Social History Narrative  ? Married   ? Two children   ? ?Social Determinants of Health  ? ?Financial Resource Strain: Low Risk   ? Difficulty of Paying Living Expenses: Not hard at all  ?Food Insecurity: No Food Insecurity  ? Worried About  Programme researcher, broadcasting/film/video in the Last Year: Never true  ? Ran Out of Food in the Last Year: Never true  ?Transportation Needs: No Transportation Needs  ? Lack of Transportation (Medical): No  ? Lack of Transportation (Non-Medical): No  ?Physical Activity: Insufficiently Active  ? Days of Exercise per Week: 3 days  ? Minutes of Exercise per Session: 40 min  ?Stress: No Stress Concern Present  ? Feeling of Stress : Not at all  ?Social Connections: Moderately Isolated  ? Frequency of Communication with Friends and Family: More than three times a week  ? Frequency of Social Gatherings with Friends and Family: More than three times a week  ? Attends Religious Services: 1 to 4 times per year  ? Active Member of Clubs or Organizations: No  ? Attends Banker Meetings: Not on file  ? Marital Status: Never married  ?Intimate Partner Violence: Not on file  ? ? ?History reviewed. No pertinent surgical history. ? ?Family History  ?Problem Relation Age of Onset  ? Hypertension Paternal Grandmother   ? Hypertension Mother   ? Diabetes Maternal Uncle   ? Diabetes Paternal Uncle   ? ? ?No Known Allergies ? ?Current Outpatient  Medications on File Prior to Visit  ?Medication Sig Dispense Refill  ? varenicline (CHANTIX PAK) 0.5 MG X 11 & 1 MG X 42 tablet Take one 0.5 mg tablet by mouth once daily for 3 days, then increase to one 0.5 mg tablet twice daily for 4 days, then increase to one 1 mg tablet twice daily. 53 tablet 0  ? ?No current facility-administered medications on file prior to visit.  ? ? ?BP (!) 140/110   Pulse 79   Temp 98.6 ?F (37 ?C) (Oral)   Ht 5' 8.75" (1.746 m)   Wt 167 lb (75.8 kg)   SpO2 99%   BMI 24.84 kg/m?  ? ? ?   ?Objective:  ? Physical Exam ?Vitals and nursing note reviewed.  ?Constitutional:   ?   Appearance: Normal appearance.  ?Cardiovascular:  ?   Rate and Rhythm: Normal rate and regular rhythm.  ?   Pulses: Normal pulses.  ?   Heart sounds: Normal heart sounds.  ?Pulmonary:  ?   Effort:  Pulmonary effort is normal.  ?   Breath sounds: Normal breath sounds.  ?Skin: ?   General: Skin is warm and dry.  ?Neurological:  ?   General: No focal deficit present.  ?   Mental Status: He is alert and oriented to person, place, and time.  ?   Motor: Weakness present.  ?   Gait: Gait abnormal.  ?Psychiatric:     ?   Mood and Affect: Mood normal.     ?   Behavior: Behavior normal.     ?   Thought Content: Thought content normal.  ? ?   ?Assessment & Plan:  ?1. Acute right-sided low back pain with right-sided sciatica ?-Sent in Flexeril and tramadol.  He was advised not to take these medications at the same time.  He will follow-up with orthopedics as it sounds like he needs another epidural injection.  Continue to stay out of work until evaluated by Ortho ?- cyclobenzaprine (FLEXERIL) 10 MG tablet; Take 1 tablet (10 mg total) by mouth 3 (three) times daily as needed for muscle spasms.  Dispense: 15 tablet; Refill: 0 ?- traMADol (ULTRAM) 50 MG tablet; Take 1 tablet (50 mg total) by mouth every 8 (eight) hours as needed for up to 5 days.  Dispense: 15 tablet; Refill: 0 ? ?Shirline Frees, NP ? ?

## 2021-05-21 NOTE — Patient Instructions (Addendum)
Call Dr. Ophelia Charter, you likely need another steroid injection  ? ?  ?Address: [address], [place], [place] [address] ?Phone: [phone] ?

## 2021-05-22 ENCOUNTER — Encounter: Payer: Self-pay | Admitting: Adult Health

## 2021-05-22 ENCOUNTER — Ambulatory Visit (INDEPENDENT_AMBULATORY_CARE_PROVIDER_SITE_OTHER): Payer: BC Managed Care – PPO | Admitting: Adult Health

## 2021-05-22 VITALS — BP 140/90 | HR 79 | Temp 98.2°F | Ht 68.75 in | Wt 167.0 lb

## 2021-05-22 DIAGNOSIS — L918 Other hypertrophic disorders of the skin: Secondary | ICD-10-CM

## 2021-05-22 DIAGNOSIS — M5441 Lumbago with sciatica, right side: Secondary | ICD-10-CM | POA: Diagnosis not present

## 2021-05-22 DIAGNOSIS — M5116 Intervertebral disc disorders with radiculopathy, lumbar region: Secondary | ICD-10-CM | POA: Diagnosis not present

## 2021-05-22 NOTE — Progress Notes (Signed)
? ?Subjective:  ? ? Patient ID: David Barker, male    DOB: 28-Jan-1975, 46 y.o.   MRN: [id number] ? ?HPI ? ?46 year old male who  has a past medical history of Back pain and Pulmonary embolism (HCC) (2006). ? ?He presents to the office today for symptomatic skin tag in his right axilla.  He was seen yesterday for continued right sided sciatica with known lumbar disc herniation but he forgot to mention this to me.  He has a skin tag in his right axilla that is very sensitive and is aggravated with movement, clothing, and hygiene such as showering and applying deodorant  .  He would like this removed. ? ?Additionally, he does have a follow-up appointment with orthopedics due to right sided sciatica pain but the closest appointment they had was 21 days away. ? ?Review of Systems ?See HPI  ? ?Past Medical History:  ?Diagnosis Date  ? Back pain   ? Pulmonary embolism (HCC) 2006  ? ? ?Social History  ? ?Socioeconomic History  ? Marital status: Single  ?  Spouse name: Not on file  ? Number of children: Not on file  ? Years of education: Not on file  ? Highest education level: Some college, no degree  ?Occupational History  ? Not on file  ?Tobacco Use  ? Smoking status: Some Days  ?  Packs/day: 0.20  ?  Years: 3.00  ?  Pack years: 0.60  ?  Types: Cigarettes  ? Smokeless tobacco: Never  ?Substance and Sexual Activity  ? Alcohol use: Yes  ?  Alcohol/week: 0.0 standard drinks  ?  Comment: occasional  ? Drug use: No  ? Sexual activity: Never  ?  Birth control/protection: Abstinence  ?Other Topics Concern  ? Not on file  ?Social History Narrative  ? Married   ? Two children   ? ?Social Determinants of Health  ? ?Financial Resource Strain: Low Risk   ? Difficulty of Paying Living Expenses: Not hard at all  ?Food Insecurity: No Food Insecurity  ? Worried About Programme researcher, broadcasting/film/video in the Last Year: Never true  ? Ran Out of Food in the Last Year: Never true  ?Transportation Needs: No Transportation Needs  ? Lack of Transportation  (Medical): No  ? Lack of Transportation (Non-Medical): No  ?Physical Activity: Insufficiently Active  ? Days of Exercise per Week: 3 days  ? Minutes of Exercise per Session: 40 min  ?Stress: No Stress Concern Present  ? Feeling of Stress : Not at all  ?Social Connections: Moderately Isolated  ? Frequency of Communication with Friends and Family: More than three times a week  ? Frequency of Social Gatherings with Friends and Family: More than three times a week  ? Attends Religious Services: 1 to 4 times per year  ? Active Member of Clubs or Organizations: No  ? Attends Banker Meetings: Not on file  ? Marital Status: Never married  ?Intimate Partner Violence: Not on file  ? ? ?History reviewed. No pertinent surgical history. ? ?Family History  ?Problem Relation Age of Onset  ? Hypertension Paternal Grandmother   ? Hypertension Mother   ? Diabetes Maternal Uncle   ? Diabetes Paternal Uncle   ? ? ?No Known Allergies ? ?Current Outpatient Medications on File Prior to Visit  ?Medication Sig Dispense Refill  ? cyclobenzaprine (FLEXERIL) 10 MG tablet Take 1 tablet (10 mg total) by mouth 3 (three) times daily as needed for muscle spasms. 15 tablet  0  ? traMADol (ULTRAM) 50 MG tablet Take 1 tablet (50 mg total) by mouth every 8 (eight) hours as needed for up to 5 days. 15 tablet 0  ? varenicline (CHANTIX PAK) 0.5 MG X 11 & 1 MG X 42 tablet Take one 0.5 mg tablet by mouth once daily for 3 days, then increase to one 0.5 mg tablet twice daily for 4 days, then increase to one 1 mg tablet twice daily. 53 tablet 0  ? ?No current facility-administered medications on file prior to visit.  ? ? ?BP 140/90   Pulse 79   Temp 98.2 ?F (36.8 ?C) (Oral)   Ht 5' 8.75" (1.746 m)   Wt 167 lb (75.8 kg)   SpO2 98%   BMI 24.84 kg/m?  ? ? ?   ?Objective:  ? Physical Exam ?Vitals and nursing note reviewed.  ?Constitutional:   ?   Appearance: Normal appearance.  ?Skin: ?   General: Skin is warm and dry.  ?   Capillary Refill:  Capillary refill takes less than 2 seconds.  ?   Comments: Skin tag in right axilla noted   ?Neurological:  ?   General: No focal deficit present.  ?   Mental Status: He is alert and oriented to person, place, and time.  ?Psychiatric:     ?   Mood and Affect: Mood normal.     ?   Behavior: Behavior normal.     ?   Thought Content: Thought content normal.     ?   Judgment: Judgment normal.  ? ?   ?Assessment & Plan:  ?1. Acquired skin tag ?- verbal consent obtained. Using scissors,  single skin tag was removed. Patient tolerated procedure well  ? ?2. Acute right-sided low back pain with right-sided sciatica ?- Will order MRI due to worsening pain and has failed treatment that usually resolves his pain.  ?- MR Lumbar Spine Wo Contrast; Future ? ?3. Lumbar disc herniation with radiculopathy ? ?- MR Lumbar Spine Wo Contrast; Future ? ?Shirline Frees, NP ? ?

## 2021-05-24 ENCOUNTER — Ambulatory Visit: Payer: BC Managed Care – PPO | Admitting: Adult Health

## 2021-05-30 ENCOUNTER — Encounter: Payer: Self-pay | Admitting: Adult Health

## 2021-05-31 NOTE — Telephone Encounter (Signed)
Note written and sent to [facility]. Pt notified of update.  ?

## 2021-05-31 NOTE — Telephone Encounter (Signed)
Please advise 

## 2021-06-05 ENCOUNTER — Ambulatory Visit
Admission: RE | Admit: 2021-06-05 | Discharge: 2021-06-05 | Disposition: A | Discharge status: Home / Self Care | Payer: BC Managed Care – PPO | Source: Ambulatory Visit | Attending: Adult Health | Admitting: Adult Health

## 2021-06-05 ENCOUNTER — Encounter: Payer: Self-pay | Admitting: Adult Health

## 2021-06-05 DIAGNOSIS — M48061 Spinal stenosis, lumbar region without neurogenic claudication: Secondary | ICD-10-CM | POA: Diagnosis not present

## 2021-06-05 DIAGNOSIS — M545 Low back pain, unspecified: Secondary | ICD-10-CM | POA: Diagnosis not present

## 2021-06-05 DIAGNOSIS — M5116 Intervertebral disc disorders with radiculopathy, lumbar region: Secondary | ICD-10-CM

## 2021-06-05 DIAGNOSIS — M5441 Lumbago with sciatica, right side: Secondary | ICD-10-CM

## 2021-06-05 DIAGNOSIS — M5416 Radiculopathy, lumbar region: Secondary | ICD-10-CM | POA: Diagnosis not present

## 2021-06-06 NOTE — Telephone Encounter (Signed)
Forms just received and printed will be placed on providers desk. Pt notified of update.

## 2021-06-12 ENCOUNTER — Encounter: Payer: Self-pay | Admitting: Orthopaedic Surgery

## 2021-06-12 ENCOUNTER — Ambulatory Visit (INDEPENDENT_AMBULATORY_CARE_PROVIDER_SITE_OTHER): Payer: BC Managed Care – PPO | Admitting: Orthopaedic Surgery

## 2021-06-12 VITALS — BP 122/80 | HR 108 | Ht 69.0 in | Wt 170.0 lb

## 2021-06-12 DIAGNOSIS — M5126 Other intervertebral disc displacement, lumbar region: Secondary | ICD-10-CM

## 2021-06-12 MED ORDER — PREDNISONE 10 MG (21) PO TBPK: ORAL_TABLET | ORAL | 0 refills | Status: DC

## 2021-06-12 NOTE — Progress Notes (Signed)
Office Visit Note   Patient: David Barker           Date of Birth: [birth year]           MRN: [id number] Visit Date: 06/12/2021              Requested by: Shirline Frees, NP [address] [place],  [place] [address] PCP: Shirline Frees, NP   Assessment & Plan: Visit Diagnoses: No diagnosis found.  Plan: Patient with new L5-S1 right disc herniation adjacent to the right S1 nerve root with radiculopathy and positive nerve root tension signs.  Work slip given no work x4 weeks.  We will set him up for single epidural if this not effective then microdiscectomy will be considered.  He certainly has significant enough pain with weakness  that he is not able to do his normal work activity.  Follow-Up Instructions: No follow-ups on file.   Orders:  No orders of the defined types were placed in this encounter.  No orders of the defined types were placed in this encounter.     Procedures: No procedures performed   Clinical Data: No additional findings.   Subjective: Chief Complaint  Patient presents with   Lower Back - Pain    HPI 46 year old male with back pain onset 1 month ago.  Patient had previous injection 12/27/2019 and states injection has worn off.  He thinks the pain is the worst that its been.  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  We have had previous discussions with his back problems with recommendations for a job where he does not have to do as much lifting turning twisting etc  We have previously discussed work activity where he does not have to do as much turning and lifting of heavy containers of pain.  Recent MRI scan 06/05/2021 was reviewed images and report again with copy of report today.  Patient is complained of significant increase in back pain and right leg pain which has been present for the last several weeks with numbness down to his toe and pain in the lateral side of his right foot.  Review of Systems updated  unchanged.   Objective: Vital Signs: BP 122/80   Pulse (!) 108   Ht 5\' 9"  (1.753 m)   Wt 170 lb (77.1 kg)   BMI 25.10 kg/m   Physical Exam Constitutional:      Appearance: He is well-developed.  HENT:     Head: Normocephalic and atraumatic.     Right Ear: External ear normal.     Left Ear: External ear normal.  Eyes:     Pupils: Pupils are equal, round, and reactive to light.  Neck:     Thyroid: No thyromegaly.     Trachea: No tracheal deviation.  Cardiovascular:     Rate and Rhythm: Normal rate.  Pulmonary:     Effort: Pulmonary effort is normal.     Breath sounds: No wheezing.  Abdominal:     General: Bowel sounds are normal.     Palpations: Abdomen is soft.  Musculoskeletal:     Cervical back: Neck supple.  Skin:    General: Skin is warm and dry.     Capillary Refill: Capillary refill takes less than 2 seconds.  Neurological:     Mental Status: He is alert and oriented to person, place, and time.  Psychiatric:        Behavior: Behavior normal.  Thought Content: Thought content normal.        Judgment: Judgment normal.    Ortho Exam patient has right calf weakness not able to single stance toe raise on the right can do it easily on the left with 15 reps quickly and easily.  Negative logroll the hips knee jerk is intact trace ankle jerk.  Anterior tib is strong.  Positive straight leg raising right side 70 degrees negative on the left at 100 degrees.  Positive popliteal compression test right side.  Specialty Comments:  No specialty comments available.  Imaging: Narrative & Impression  CLINICAL DATA:  Lumbar radiculopathy. Symptoms persist with greater than 6 weeks of treatment. Pain radiates to the right leg.   EXAM: MRI LUMBAR SPINE WITHOUT CONTRAST   TECHNIQUE: Multiplanar, multisequence MR imaging of the lumbar spine was performed. No intravenous contrast was administered.   COMPARISON:  11/22/2019   FINDINGS: Segmentation:  5 lumbar type  vertebral bodies.   Alignment:  Normal   Vertebrae: No fracture or focal bone lesion. Chronic discogenic endplate changes at L4-5.   Conus medullaris and cauda equina: Conus extends to the T12-L1 level. Conus and cauda equina appear normal.   Paraspinal and other soft tissues: Negative   Disc levels:   No abnormality at L2-3 or above.   L3-4: Minimal desiccation and bulging of the disc.  No stenosis.   L4-5: Chronic disc degeneration with shallow protrusion slightly more prominent towards the left. Mild facet and ligamentous hypertrophy. Mild stenosis of the subarticular lateral recesses left more than right, but without definite neural compression. Findings have worsened slightly since 2021.   L5-S1: Disc degeneration with a shallow disc herniation more prominent towards the right. Disc material adjacent to the right S1 nerve could focally compress that structure. Mild right foraminal encroachment. These findings are newly seen since 2021.   IMPRESSION: Newly seen right posterolateral disc herniation at L5-S1 with a small disc fragment adjacent to the right S1 nerve which could focally irritate that structure. Mild right foraminal narrowing.   Chronic disc degeneration L4-5, slightly worsened since 2021. Narrowing of the lateral recesses left more than right which could possibly be symptomatic, though not as likely as the findings at L5-S1.     Electronically Signed   By: Paulina Fusi M.D.   On: 06/05/2021 17:05     PMFS History: Patient Active Problem List   Diagnosis Date Noted   Lumbar disc herniation with radiculopathy 12/02/2017   Lip swelling 08/16/2015   Past Medical History:  Diagnosis Date   Back pain    Pulmonary embolism (HCC) 2006    Family History  Problem Relation Age of Onset   Hypertension Paternal Grandmother    Hypertension Mother    Diabetes Maternal Uncle    Diabetes Paternal Uncle     No past surgical history on file. Social  History   Occupational History   Not on file  Tobacco Use   Smoking status: Some Days    Packs/day: 0.20    Years: 3.00    Pack years: 0.60    Types: Cigarettes   Smokeless tobacco: Never  Substance and Sexual Activity   Alcohol use: Yes    Alcohol/week: 0.0 standard drinks    Comment: occasional   Drug use: No   Sexual activity: Never    Birth control/protection: Abstinence

## 2021-06-25 ENCOUNTER — Telehealth: Payer: Self-pay | Admitting: Orthopaedic Surgery

## 2021-06-25 NOTE — Telephone Encounter (Signed)
[employer] forms received. To [employer]. ?

## 2021-07-16 ENCOUNTER — Ambulatory Visit: Payer: Self-pay

## 2021-07-16 ENCOUNTER — Encounter: Payer: Self-pay | Admitting: Physical Medicine and Rehabilitation

## 2021-07-16 ENCOUNTER — Telehealth: Payer: Self-pay | Admitting: Orthopaedic Surgery

## 2021-07-16 ENCOUNTER — Ambulatory Visit (INDEPENDENT_AMBULATORY_CARE_PROVIDER_SITE_OTHER): Payer: BC Managed Care – PPO | Admitting: Physical Medicine and Rehabilitation

## 2021-07-16 VITALS — BP 133/89 | HR 79

## 2021-07-16 DIAGNOSIS — M5416 Radiculopathy, lumbar region: Secondary | ICD-10-CM | POA: Diagnosis not present

## 2021-07-16 MED ORDER — METHYLPREDNISOLONE ACETATE 80 MG/ML IJ SUSP
80.0000 mg | Freq: Once | INTRAMUSCULAR | Status: AC
  Administered 2021-07-16: 80 mg

## 2021-07-16 NOTE — Progress Notes (Signed)
Pt state lower back pain that travels to his buttocks and down the right leg. Pt state walking, standing and sitting makes the pain worse. Pt state he takes over the counter pain meds to help ease his pain.  Numeric Pain Rating Scale and Functional Assessment Average Pain 7   In the last MONTH (on 0-10 scale) has pain interfered with the following?  1. General activity like being  able to carry out your everyday physical activities such as walking, climbing stairs, carrying groceries, or moving a chair?  Rating(10)   +Driver, -BT, -Dye Allergies.

## 2021-07-16 NOTE — Telephone Encounter (Signed)
Patient needs an updated out of work note. Dr. Alvester Morin was not able to get him in for the injection until 07/16/21 and he was told that it will take about 2 weeks for the injection to work.  The last out of work note from 06/12/21 states out for 4 weeks ( 08/09/21) with the fact of getting the injection within that time frame.   Patient's follow up with Dr Ophelia Charter is not until 08/06/21. Which he was to return to work afterwards.   Please call patient at [phone]. He needs note ASAP.

## 2021-07-16 NOTE — Telephone Encounter (Signed)
Copy placed at the front desk

## 2021-07-25 ENCOUNTER — Telehealth: Payer: Self-pay | Admitting: Orthopaedic Surgery

## 2021-07-25 NOTE — Telephone Encounter (Signed)
Received $25.00 cash and a medical records release form/ Forwarding to [employer] today 

## 2021-07-25 NOTE — Telephone Encounter (Signed)
[employer] forms received. To [employer]. ?

## 2021-08-06 ENCOUNTER — Encounter: Payer: Self-pay | Admitting: Orthopaedic Surgery

## 2021-08-06 ENCOUNTER — Ambulatory Visit (INDEPENDENT_AMBULATORY_CARE_PROVIDER_SITE_OTHER): Payer: BC Managed Care – PPO | Admitting: Orthopaedic Surgery

## 2021-08-06 VITALS — BP 124/85 | HR 80 | Ht 69.0 in | Wt 170.0 lb

## 2021-08-06 DIAGNOSIS — M5116 Intervertebral disc disorders with radiculopathy, lumbar region: Secondary | ICD-10-CM

## 2021-08-06 NOTE — Progress Notes (Signed)
Office Visit Note   Patient: David Barker           Date of Birth: December 14, 1975           MRN: [id number] Visit Date: 08/06/2021              Requested by: Shirline Frees, NP [address] [place],  [place] [address] PCP: Shirline Frees, NP   Assessment & Plan: Visit Diagnoses:  1. Lumbar disc herniation with radiculopathy     Plan: Work slip given for resuming work on Tuesday the 25th.  We reviewed [name] from 06/05/2021 which had disc degeneration chronic at L4-5 slightly worse from 2021 and new posterior lateral disc herniation at L5-S1.  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  He is happy the results of the injection will follow-up as needed.  Follow-Up Instructions: No follow-ups on file.   Orders:  No orders of the defined types were placed in this encounter.  No orders of the defined types were placed in this encounter.     Procedures: No procedures performed   Clinical Data: No additional findings.   Subjective: Chief Complaint  Patient presents with   Lower Back - Pain    HPI 46 year old male returns for follow-up after epidural injection 07/16/2021.  Patient got improvement still has some tenderness right peroneal compartment of his leg starting at the fibular neck.  Intermittent symptoms in his right leg but he states he has got significant improvement with the injection.  Return to work slip given for 08/13/2021.  Review of Systems updated unchanged.   Objective: Vital Signs: BP 124/85   Pulse 80   Ht 5\' 9"  (1.753 m)   Wt 170 lb (77.1 kg)   BMI 25.10 kg/m   Physical Exam Constitutional:      Appearance: He is well-developed.  HENT:     Head: Normocephalic and atraumatic.     Right Ear: External ear normal.     Left Ear: External ear normal.  Eyes:     Pupils: Pupils are equal, round, and reactive to light.  Neck:      Thyroid: No thyromegaly.     Trachea: No tracheal deviation.  Cardiovascular:     Rate and Rhythm: Normal rate.  Pulmonary:     Effort: Pulmonary effort is normal.     Breath sounds: No wheezing.  Abdominal:     General: Bowel sounds are normal.     Palpations: Abdomen is soft.  Musculoskeletal:     Cervical back: Neck supple.  Skin:    General: Skin is warm and dry.     Capillary Refill: Capillary refill takes less than 2 seconds.  Neurological:     Mental Status: He is alert and oriented to person, place, and time.  Psychiatric:        Behavior: Behavior normal.        Thought Content: Thought content normal.        Judgment: Judgment normal.     Ortho Exam patient is able to heel and toe walk.  Anterior tib gastrocsoleus is active.  Still has mild discomfort with popliteal compression and sciatic notch tenderness on the right.  Negative on the left.  Specialty Comments:  MRI LUMBAR SPINE WITHOUT CONTRAST   TECHNIQUE: Multiplanar, multisequence MR imaging of the lumbar spine was performed. No intravenous contrast  was administered.   COMPARISON:  11/22/2019   FINDINGS: Segmentation:  5 lumbar type vertebral bodies.   Alignment:  Normal   Vertebrae: No fracture or focal bone lesion. Chronic discogenic endplate changes at L4-5.   Conus medullaris and cauda equina: Conus extends to the T12-L1 level. Conus and cauda equina appear normal.   Paraspinal and other soft tissues: Negative   Disc levels:   No abnormality at L2-3 or above.   L3-4: Minimal desiccation and bulging of the disc.  No stenosis.   L4-5: Chronic disc degeneration with shallow protrusion slightly more prominent towards the left. Mild facet and ligamentous hypertrophy. Mild stenosis of the subarticular lateral recesses left more than right, but without definite neural compression. Findings have worsened slightly since 2021.   L5-S1: Disc degeneration with a shallow disc herniation  more prominent towards the right. Disc material adjacent to the right S1 nerve could focally compress that structure. Mild right foraminal encroachment. These findings are newly seen since 2021.   IMPRESSION: Newly seen right posterolateral disc herniation at L5-S1 with a small disc fragment adjacent to the right S1 nerve which could focally irritate that structure. Mild right foraminal narrowing.   Chronic disc degeneration L4-5, slightly worsened since 2021. Narrowing of the lateral recesses left more than right which could possibly be symptomatic, though not as likely as the findings at L5-S1.     Electronically Signed   By: Paulina Fusi M.D.   On: 06/05/2021 17:05  Imaging: No results found.   PMFS History: Patient Active Problem List   Diagnosis Date Noted   Lumbar disc herniation with radiculopathy 12/02/2017   Lip swelling 08/16/2015   Past Medical History:  Diagnosis Date   Back pain    Pulmonary embolism (HCC) 2006    Family History  Problem Relation Age of Onset   Hypertension Paternal Grandmother    Hypertension Mother    Diabetes Maternal Uncle    Diabetes Paternal Uncle     History reviewed. No pertinent surgical history. Social History   Occupational History   Not on file  Tobacco Use   Smoking status: Some Days    Packs/day: 0.20    Years: 3.00    Total pack years: 0.60    Types: Cigarettes   Smokeless tobacco: Never  Substance and Sexual Activity   Alcohol use: Yes    Alcohol/week: 0.0 standard drinks of alcohol    Comment: occasional   Drug use: No   Sexual activity: Never    Birth control/protection: Abstinence

## 2021-08-13 NOTE — Progress Notes (Signed)
David Barker - 46 y.o. male MRN [id number]  Date of birth: Apr 09, 1975  Office Visit Note: Visit Date: 07/16/2021 PCP: Shirline Frees, NP Referred by: Shirline Frees, NP  Subjective: Chief Complaint  Patient presents with   Lower Back - Pain   Right Leg - Pain   HPI:  David Barker is a 46 y.o. male who comes in today at the request of Dr. Annell Greening for planned Right S1-2 Lumbar Transforaminal epidural steroid injection with fluoroscopic guidance.  The patient has failed conservative care including home exercise, medications, time and activity modification.  This injection will be diagnostic and hopefully therapeutic.  Please see requesting physician notes for further details and justification.   ROS Otherwise per HPI.  Assessment & Plan: Visit Diagnoses:    ICD-10-CM   1. Lumbar radiculopathy  M54.16 XR C-ARM NO REPORT    Epidural Steroid injection    methylPREDNISolone acetate (DEPO-MEDROL) injection 80 mg      Plan: No additional findings.   Meds & Orders:  Meds ordered this encounter  Medications   methylPREDNISolone acetate (DEPO-MEDROL) injection 80 mg    Orders Placed This Encounter  Procedures   XR C-ARM NO REPORT   Epidural Steroid injection    Follow-up: Return for visit to requesting provider as needed.   Procedures: No procedures performed  S1 Lumbosacral Transforaminal Epidural Steroid Injection - Sub-Pedicular Approach with Fluoroscopic Guidance   Patient: David Barker      Date of Birth: 1975/12/19 MRN: [id number] PCP: Shirline Frees, NP      Visit Date: 07/16/2021   Universal Protocol:    Date/Time: 07/25/238:35 PM  Consent Given By: the patient  Position:  PRONE  Additional Comments: Vital signs were monitored before and after the procedure. Patient was prepped and draped in the usual sterile fashion. The correct patient, procedure, and site was verified.   Injection Procedure Details:  Procedure Site One Meds Administered:  Meds ordered  this encounter  Medications   methylPREDNISolone acetate (DEPO-MEDROL) injection 80 mg    Laterality: Left  Location/Site:  S1 Foramen   Needle size: 22 ga.  Needle type: Spinal  Needle Placement: Transforaminal  Findings:   -Comments: Excellent flow of contrast along the nerve, nerve root and into the epidural space.  Epidurogram: Contrast epidurogram showed no nerve root cut off or restricted flow pattern.  Procedure Details: After squaring off the sacral end-plate to get a true AP view, the C-arm was positioned so that the best possible view of the S1 foramen was visualized. The soft tissues overlying this structure were infiltrated with 2-3 ml. of 1% Lidocaine without Epinephrine.    The spinal needle was inserted toward the target using a "trajectory" view along the fluoroscope beam.  Under AP and lateral visualization, the needle was advanced so it did not puncture dura. Biplanar projections were used to confirm position. Aspiration was confirmed to be negative for CSF and/or blood. A 1-2 ml. volume of Isovue-250 was injected and flow of contrast was noted at each level. Radiographs were obtained for documentation purposes.   After attaining the desired flow of contrast documented above, a 0.5 to 1.0 ml test dose of 0.25% Marcaine was injected into each respective transforaminal space.  The patient was observed for 90 seconds post injection.  After no sensory deficits were reported, and normal lower extremity motor function was noted,   the above injectate was administered so that equal amounts of the injectate were placed at each foramen (level) into the  transforaminal epidural space.   Additional Comments:  The patient tolerated the procedure well Dressing: Band-Aid with 2 x 2 sterile gauze    Post-procedure details: Patient was observed during the procedure. Post-procedure instructions were reviewed.  Patient left the clinic in stable condition.   Clinical  History: MRI LUMBAR SPINE WITHOUT CONTRAST   TECHNIQUE: Multiplanar, multisequence MR imaging of the lumbar spine was performed. No intravenous contrast was administered.   COMPARISON:  11/22/2019   FINDINGS: Segmentation:  5 lumbar type vertebral bodies.   Alignment:  Normal   Vertebrae: No fracture or focal bone lesion. Chronic discogenic endplate changes at L4-5.   Conus medullaris and cauda equina: Conus extends to the T12-L1 level. Conus and cauda equina appear normal.   Paraspinal and other soft tissues: Negative   Disc levels:   No abnormality at L2-3 or above.   L3-4: Minimal desiccation and bulging of the disc.  No stenosis.   L4-5: Chronic disc degeneration with shallow protrusion slightly more prominent towards the left. Mild facet and ligamentous hypertrophy. Mild stenosis of the subarticular lateral recesses left more than right, but without definite neural compression. Findings have worsened slightly since 2021.   L5-S1: Disc degeneration with a shallow disc herniation more prominent towards the right. Disc material adjacent to the right S1 nerve could focally compress that structure. Mild right foraminal encroachment. These findings are newly seen since 2021.   IMPRESSION: Newly seen right posterolateral disc herniation at L5-S1 with a small disc fragment adjacent to the right S1 nerve which could focally irritate that structure. Mild right foraminal narrowing.   Chronic disc degeneration L4-5, slightly worsened since 2021. Narrowing of the lateral recesses left more than right which could possibly be symptomatic, though not as likely as the findings at L5-S1.     Electronically Signed   By: Paulina Fusi M.D.   On: 06/05/2021 17:05     Objective:  VS:  HT:    WT:   BMI:     BP:133/89  HR:79bpm  TEMP: ( )  RESP:  Physical Exam Vitals and nursing note reviewed.  Constitutional:      General: He is not in acute distress.    Appearance:  Normal appearance. He is not ill-appearing.  HENT:     Head: Normocephalic and atraumatic.     Right Ear: External ear normal.     Left Ear: External ear normal.     Nose: No congestion.  Eyes:     Extraocular Movements: Extraocular movements intact.  Cardiovascular:     Rate and Rhythm: Normal rate.     Pulses: Normal pulses.  Pulmonary:     Effort: Pulmonary effort is normal. No respiratory distress.  Abdominal:     General: There is no distension.     Palpations: Abdomen is soft.  Musculoskeletal:        General: No tenderness or signs of injury.     Cervical back: Neck supple.     Right lower leg: No edema.     Left lower leg: No edema.     Comments: Patient has good distal strength without clonus.  Skin:    Findings: No erythema or rash.  Neurological:     General: No focal deficit present.     Mental Status: He is alert and oriented to person, place, and time.     Sensory: No sensory deficit.     Motor: No weakness or abnormal muscle tone.     Coordination: Coordination normal.  Psychiatric:        Mood and Affect: Mood normal.        Behavior: Behavior normal.      Imaging: No results found.

## 2021-08-13 NOTE — Procedures (Signed)
S1 Lumbosacral Transforaminal Epidural Steroid Injection - Sub-Pedicular Approach with Fluoroscopic Guidance   Patient: David Barker      Date of Birth: 07-03-1975 MRN: [id number] PCP: Shirline Frees, NP      Visit Date: 07/16/2021   Universal Protocol:    Date/Time: 07/25/238:35 PM  Consent Given By: the patient  Position:  PRONE  Additional Comments: Vital signs were monitored before and after the procedure. Patient was prepped and draped in the usual sterile fashion. The correct patient, procedure, and site was verified.   Injection Procedure Details:  Procedure Site One Meds Administered:  Meds ordered this encounter  Medications   methylPREDNISolone acetate (DEPO-MEDROL) injection 80 mg    Laterality: Left  Location/Site:  S1 Foramen   Needle size: 22 ga.  Needle type: Spinal  Needle Placement: Transforaminal  Findings:   -Comments: Excellent flow of contrast along the nerve, nerve root and into the epidural space.  Epidurogram: Contrast epidurogram showed no nerve root cut off or restricted flow pattern.  Procedure Details: After squaring off the sacral end-plate to get a true AP view, the C-arm was positioned so that the best possible view of the S1 foramen was visualized. The soft tissues overlying this structure were infiltrated with 2-3 ml. of 1% Lidocaine without Epinephrine.    The spinal needle was inserted toward the target using a "trajectory" view along the fluoroscope beam.  Under AP and lateral visualization, the needle was advanced so it did not puncture dura. Biplanar projections were used to confirm position. Aspiration was confirmed to be negative for CSF and/or blood. A 1-2 ml. volume of Isovue-250 was injected and flow of contrast was noted at each level. Radiographs were obtained for documentation purposes.   After attaining the desired flow of contrast documented above, a 0.5 to 1.0 ml test dose of 0.25% Marcaine was injected into each  respective transforaminal space.  The patient was observed for 90 seconds post injection.  After no sensory deficits were reported, and normal lower extremity motor function was noted,   the above injectate was administered so that equal amounts of the injectate were placed at each foramen (level) into the transforaminal epidural space.   Additional Comments:  The patient tolerated the procedure well Dressing: Band-Aid with 2 x 2 sterile gauze    Post-procedure details: Patient was observed during the procedure. Post-procedure instructions were reviewed.  Patient left the clinic in stable condition.

## 2021-08-14 ENCOUNTER — Telehealth: Payer: Self-pay | Admitting: Orthopaedic Surgery

## 2021-08-14 NOTE — Telephone Encounter (Signed)
[employer] forms received. To [employer]. ?

## 2022-01-01 ENCOUNTER — Encounter: Payer: Self-pay | Admitting: Adult Health

## 2022-01-01 ENCOUNTER — Ambulatory Visit (INDEPENDENT_AMBULATORY_CARE_PROVIDER_SITE_OTHER): Payer: BC Managed Care – PPO | Admitting: Adult Health

## 2022-01-01 VITALS — BP 140/80 | HR 95 | Temp 98.8°F | Ht 69.0 in | Wt 164.0 lb

## 2022-01-01 DIAGNOSIS — K2100 Gastro-esophageal reflux disease with esophagitis, without bleeding: Secondary | ICD-10-CM

## 2022-01-01 DIAGNOSIS — M62838 Other muscle spasm: Secondary | ICD-10-CM

## 2022-01-01 DIAGNOSIS — Z720 Tobacco use: Secondary | ICD-10-CM | POA: Diagnosis not present

## 2022-01-01 MED ORDER — VARENICLINE TARTRATE 1 MG PO TABS: 1.0000 mg | ORAL_TABLET | Freq: Two times a day (BID) | ORAL | 2 refills | Status: AC

## 2022-01-01 MED ORDER — TIZANIDINE HCL 4 MG PO TABS: 4.0000 mg | ORAL_TABLET | Freq: Four times a day (QID) | ORAL | 1 refills | Status: DC | PRN

## 2022-01-01 MED ORDER — SUCRALFATE 1 GM/10ML PO SUSP: 1.0000 g | Freq: Three times a day (TID) | ORAL | 0 refills | Status: DC

## 2022-01-01 NOTE — Progress Notes (Signed)
Subjective:    Patient ID: David Barker, male    DOB: Sep 12, 1975, 46 y.o.   MRN: [id number]  HPI 46 year old male who  has a past medical history of Back pain and Pulmonary embolism (HCC) (2006).  He presents to the office today for " twitching" of right lower leg. This has been present since May when he last had issues with his low back. He did end up having an epidural but this did not help with the twitching sensation in his left leg. The twitching causes his muscles to tightened up and is painful. This is turn is causing restless nights with insomnia. Patient states " it twitches and is painful 24 hours a day. He is staying hydrated and stretches - but when he stretches it causes worsening pain from tightness.   At home he has been using Motrin which helps slightly but this is caused stomach irritation with acid reflux-like symptoms and diarrhea.  He has not noticed any blood in his stool  He would also like to go back on Chanitx to help him quit smoking   Review of Systems See HPI   Past Medical History:  Diagnosis Date   Back pain    Pulmonary embolism (HCC) 2006    Social History   Socioeconomic History   Marital status: Single    Spouse name: Not on file   Number of children: Not on file   Years of education: Not on file   Highest education level: Some college, no degree  Occupational History   Not on file  Tobacco Use   Smoking status: Some Days    Packs/day: 0.20    Years: 3.00    Total pack years: 0.60    Types: Cigarettes   Smokeless tobacco: Never  Substance and Sexual Activity   Alcohol use: Yes    Alcohol/week: 0.0 standard drinks of alcohol    Comment: occasional   Drug use: No   Sexual activity: Never    Birth control/protection: Abstinence  Other Topics Concern   Not on file  Social History Narrative   Married    Two children    Social Determinants of Health   Financial Resource Strain: Low Risk  (05/09/2021)   Overall Financial Resource Strain  (CARDIA)    Difficulty of Paying Living Expenses: Not hard at all  Food Insecurity: No Food Insecurity (05/09/2021)   Hunger Vital Sign    Worried About Running Out of Food in the Last Year: Never true    Ran Out of Food in the Last Year: Never true  Transportation Needs: No Transportation Needs (05/09/2021)   PRAPARE - Administrator, Civil Service (Medical): No    Lack of Transportation (Non-Medical): No  Physical Activity: Insufficiently Active (05/09/2021)   Exercise Vital Sign    Days of Exercise per Week: 3 days    Minutes of Exercise per Session: 40 min  Stress: No Stress Concern Present (05/09/2021)   [employer] of Occupational Health - Occupational Stress Questionnaire    Feeling of Stress : Not at all  Social Connections: Moderately Isolated (05/09/2021)   Social Connection and Isolation Panel [NHANES]    Frequency of Communication with Friends and Family: More than three times a week    Frequency of Social Gatherings with Friends and Family: More than three times a week    Attends Religious Services: 1 to 4 times per year    Active Member of Clubs or Organizations: No  Attends Banker Meetings: Not on file    Marital Status: Never married  Intimate Partner Violence: Not on file    No past surgical history on file.  Family History  Problem Relation Age of Onset   Hypertension Paternal Grandmother    Hypertension Mother    Diabetes Maternal Uncle    Diabetes Paternal Uncle     No Known Allergies  Current Outpatient Medications on File Prior to Visit  Medication Sig Dispense Refill   cyclobenzaprine (FLEXERIL) 10 MG tablet Take 1 tablet (10 mg total) by mouth 3 (three) times daily as needed for muscle spasms. 15 tablet 0   predniSONE (STERAPRED UNI-PAK 21 TAB) 10 MG (21) TBPK tablet Take 6,5,4,3,2,1 one tablet less each day daily with food. 21 tablet 0   varenicline (CHANTIX PAK) 0.5 MG X 11 & 1 MG X 42 tablet Take one 0.5 mg tablet by  mouth once daily for 3 days, then increase to one 0.5 mg tablet twice daily for 4 days, then increase to one 1 mg tablet twice daily. 53 tablet 0   No current facility-administered medications on file prior to visit.    BP (!) 140/80   Pulse 95   Temp 98.8 F (37.1 C) (Oral)   Ht 5\' 9"  (1.753 m)   Wt 164 lb (74.4 kg)   SpO2 98%   BMI 24.22 kg/m       Objective:   Physical Exam Vitals and nursing note reviewed.  Constitutional:      Appearance: Normal appearance.  Cardiovascular:     Rate and Rhythm: Normal rate and regular rhythm.     Pulses: Normal pulses.     Heart sounds: Normal heart sounds.  Pulmonary:     Effort: Pulmonary effort is normal.     Breath sounds: Normal breath sounds.  Abdominal:     General: Abdomen is flat. Bowel sounds are normal.     Palpations: Abdomen is soft.     Tenderness: There is abdominal tenderness.  Musculoskeletal:        General: Tenderness present. Normal range of motion.     Right lower leg: Tenderness present. No swelling. No edema.     Comments: He is quite tender to extend forward digitorum longus muscle and tibialis anterior  Skin:    General: Skin is warm and dry.  Neurological:     General: No focal deficit present.     Mental Status: He is alert and oriented to person, place, and time.  Psychiatric:        Mood and Affect: Mood normal.        Behavior: Behavior normal.        Thought Content: Thought content normal.        Assessment & Plan:  1. Muscle spasm -Will check B12 and vitamin D in the office today.  Will prescribe Zanaflex to help with muscle spasm.  He was advised of sedating effect of this medication.  I will have him follow-up in 1 week to see if he is any better - VITAMIN D 25 Hydroxy (Vit-D Deficiency, Fractures); Future - Vitamin B12; Future - tiZANidine (ZANAFLEX) 4 MG tablet; Take 1 tablet (4 mg total) by mouth every 6 (six) hours as needed for muscle spasms.  Dispense: 30 tablet; Refill: 1  2.  Gastroesophageal reflux disease with esophagitis without hemorrhage - stop using NSAIDS - sucralfate (CARAFATE) 1 GM/10ML suspension; Take 10 mLs (1 g total) by mouth 4 (four) times daily -  with meals and at bedtime.  Dispense: 420 mL; Refill: 0  3. Tobacco use  - varenicline (CHANTIX) 1 MG tablet; Take 1 tablet (1 mg total) by mouth 2 (two) times daily.  Dispense: 60 tablet; Refill: 2  Shirline Frees, NP  Time spent with patient today was 32 minutes which consisted of chart review, discussing diagnosis, work up, treatment answering questions and documentation.

## 2022-01-02 ENCOUNTER — Telehealth: Payer: Self-pay

## 2022-01-02 ENCOUNTER — Other Ambulatory Visit (HOSPITAL_COMMUNITY): Payer: Self-pay

## 2022-01-02 NOTE — Telephone Encounter (Signed)
Please advise if Rx can be changed to tabs

## 2022-01-02 NOTE — Telephone Encounter (Signed)
Pharmacy Patient Advocate Encounter   Received notification from [facility] that prior authorization for Sucralfate 1gm/34ml is required/requested.  Per Test Claim: the insurance requires a prior auth for the suspension but the insurance prefers the tabs. For 30 day supply of the tabs the copay is $2.98.   Please advise if to continue with the prior auth.  Status is not submitted     Key: [id number]

## 2022-01-08 ENCOUNTER — Other Ambulatory Visit: Payer: Self-pay | Admitting: Adult Health

## 2022-01-08 MED ORDER — SUCRALFATE 1 G PO TABS: 1.0000 g | ORAL_TABLET | Freq: Three times a day (TID) | ORAL | 0 refills | Status: DC

## 2022-01-08 NOTE — Telephone Encounter (Signed)
Tried to call pt to advise but no answer.  

## 2022-01-08 NOTE — Telephone Encounter (Signed)
Noted  

## 2022-01-16 ENCOUNTER — Ambulatory Visit: Payer: BC Managed Care – PPO | Admitting: Adult Health

## 2022-01-23 ENCOUNTER — Ambulatory Visit (INDEPENDENT_AMBULATORY_CARE_PROVIDER_SITE_OTHER): Payer: BC Managed Care – PPO | Admitting: Adult Health

## 2022-01-23 ENCOUNTER — Other Ambulatory Visit: Payer: Self-pay | Admitting: Adult Health

## 2022-01-23 ENCOUNTER — Encounter: Payer: Self-pay | Admitting: Adult Health

## 2022-01-23 VITALS — BP 133/88 | HR 94 | Temp 98.4°F | Ht 69.0 in | Wt 164.0 lb

## 2022-01-23 DIAGNOSIS — M62838 Other muscle spasm: Secondary | ICD-10-CM

## 2022-01-23 DIAGNOSIS — E559 Vitamin D deficiency, unspecified: Secondary | ICD-10-CM

## 2022-01-23 LAB — COMPREHENSIVE METABOLIC PANEL
ALT: 41 U/L (ref 0–53)
AST: 42 U/L — ABNORMAL HIGH (ref 0–37)
Albumin: 4.7 g/dL (ref 3.5–5.2)
Alkaline Phosphatase: 88 U/L (ref 39–117)
BUN: 11 mg/dL (ref 6–23)
CO2: 30 mEq/L (ref 19–32)
Calcium: 9.6 mg/dL (ref 8.4–10.5)
Chloride: 97 mEq/L (ref 96–112)
Creatinine, Ser: 0.85 mg/dL (ref 0.40–1.50)
GFR: 105.06 mL/min (ref >60.00)
Glucose, Bld: 104 mg/dL — ABNORMAL HIGH (ref 70–99)
Potassium: 3.9 mEq/L (ref 3.5–5.1)
Sodium: 135 mEq/L (ref 135–145)
Total Bilirubin: 1.7 mg/dL — ABNORMAL HIGH (ref 0.2–1.2)
Total Protein: 7.6 g/dL (ref 6.0–8.3)

## 2022-01-23 LAB — VITAMIN D 25 HYDROXY (VIT D DEFICIENCY, FRACTURES): VITD: 13.88 ng/mL — ABNORMAL LOW (ref 30.00–100.00)

## 2022-01-23 LAB — VITAMIN B12: Vitamin B-12: 397 pg/mL (ref 211–911)

## 2022-01-23 MED ORDER — CYCLOBENZAPRINE HCL 10 MG PO TABS: 10.0000 mg | ORAL_TABLET | Freq: Three times a day (TID) | ORAL | 0 refills | Status: DC | PRN

## 2022-01-23 MED ORDER — VITAMIN D (ERGOCALCIFEROL) 1.25 MG (50000 UNIT) PO CAPS: 50000.0000 [IU] | ORAL_CAPSULE | ORAL | 0 refills | Status: AC

## 2022-01-23 NOTE — Progress Notes (Signed)
Subjective:    Patient ID: David Barker, male    DOB: Sep 11, 1975, 47 y.o.   MRN: [id number]  HPI 47 year old male who  has a past medical history of Back pain and Pulmonary embolism ([place]) (2006).  He presents to the office today for follow up regarding muscle spasms in his right leg. He reports that he when he tries to stretch it that it " locks up". When he was seen a couple of weeks ago he was prescribed Tizanidine which did not really help  I had put in labs for him to due before he left during the last visit but he never completed these.   Muscle spasms like sensation is located on the lateral aspect of his right leg from knee to ankle and calf.    Review of Systems See HPI   Past Medical History:  Diagnosis Date   Back pain    Pulmonary embolism ([place]) 2006    Social History   Socioeconomic History   Marital status: Single    Spouse name: Not on file   Number of children: Not on file   Years of education: Not on file   Highest education level: Some college, no degree  Occupational History   Not on file  Tobacco Use   Smoking status: Some Days    Packs/day: 0.20    Years: 3.00    Total pack years: 0.60    Types: Cigarettes   Smokeless tobacco: Never  Substance and Sexual Activity   Alcohol use: Yes    Alcohol/week: 0.0 standard drinks of alcohol    Comment: occasional   Drug use: No   Sexual activity: Never    Birth control/protection: Abstinence  Other Topics Concern   Not on file  Social History Narrative   Married    Two children    Social Determinants of Health   Financial Resource Strain: Low Risk  (05/09/2021)   Overall Financial Resource Strain (CARDIA)    Difficulty of Paying Living Expenses: Not hard at all  Food Insecurity: No Food Insecurity (05/09/2021)   Hunger Vital Sign    Worried About Running Out of Food in the Last Year: Never true    Ran Out of Food in the Last Year: Never true  Transportation Needs: No Transportation Needs (05/09/2021)    PRAPARE - Hydrologist (Medical): No    Lack of Transportation (Non-Medical): No  Physical Activity: Insufficiently Active (05/09/2021)   Exercise Vital Sign    Days of Exercise per Week: 3 days    Minutes of Exercise per Session: 40 min  Stress: No Stress Concern Present (05/09/2021)   [place]    Feeling of Stress : Not at all  Social Connections: Moderately Isolated (05/09/2021)   Social Connection and Isolation Panel [NHANES]    Frequency of Communication with Friends and Family: More than three times a week    Frequency of Social Gatherings with Friends and Family: More than three times a week    Attends Religious Services: 1 to 4 times per year    Active Member of [employer] or Organizations: No    Attends Music therapist: Not on file    Marital Status: Never married  Intimate Partner Violence: Not on file    History reviewed. No pertinent surgical history.  Family History  Problem Relation Age of Onset   Hypertension Paternal Grandmother    Hypertension Mother  Diabetes Maternal Uncle    Diabetes Paternal Uncle     No Known Allergies  Current Outpatient Medications on File Prior to Visit  Medication Sig Dispense Refill   sucralfate (CARAFATE) 1 g tablet Take 1 tablet (1 g total) by mouth 4 (four) times daily -  with meals and at bedtime. 120 tablet 0   tiZANidine (ZANAFLEX) 4 MG tablet Take 1 tablet (4 mg total) by mouth every 6 (six) hours as needed for muscle spasms. 30 tablet 1   varenicline (CHANTIX) 1 MG tablet Take 1 tablet (1 mg total) by mouth 2 (two) times daily. 60 tablet 2   No current facility-administered medications on file prior to visit.    BP (!) 150/110   Pulse 94   Temp 98.4 F (36.9 C) (Oral)   Ht 5\' 9"  (1.753 m)   Wt 164 lb (74.4 kg)   SpO2 97%   BMI 24.22 kg/m       Objective:   Physical Exam Vitals and nursing note reviewed.   Constitutional:      Appearance: Normal appearance.  Musculoskeletal:        General: Tenderness (no spasming noted) present. Normal range of motion.  Skin:    General: Skin is warm and dry.     Capillary Refill: Capillary refill takes less than 2 seconds.  Neurological:     General: No focal deficit present.     Mental Status: He is alert and oriented to person, place, and time.  Psychiatric:        Mood and Affect: Mood normal.        Behavior: Behavior normal.        Thought Content: Thought content normal.        Judgment: Judgment normal.       Assessment & Plan:  1. Muscle spasm - will check his b12 and Vitamin D levels  - try flexeril. Consider sports medicine  - Comprehensive metabolic panel; Future - cyclobenzaprine (FLEXERIL) 10 MG tablet; Take 1 tablet (10 mg total) by mouth 3 (three) times daily as needed for muscle spasms.  Dispense: 15 tablet; Refill: 0  Dorothyann Peng, NP

## 2022-01-24 ENCOUNTER — Telehealth: Payer: Self-pay | Admitting: Adult Health

## 2022-01-24 NOTE — Telephone Encounter (Signed)
Updated patient on labs. His Vitamin D is very low and is likely causing symptoms. Will place on 50,000 units weekly x 3 months

## 2022-02-12 ENCOUNTER — Other Ambulatory Visit: Payer: Self-pay | Admitting: Adult Health

## 2022-05-26 IMAGING — MR MR LUMBAR SPINE W/O CM
4 of 5 series · 27 of 48 positions shown · non-contrast
Comparison: 11/22/2019

CLINICAL DATA: Lumbar radiculopathy. Symptoms persist with greater
than 6 weeks of treatment. Pain radiates to the right leg.

EXAM:
MRI LUMBAR SPINE WITHOUT CONTRAST
TECHNIQUE: Multiplanar, multisequence MR imaging of the lumbar spine was
performed. No intravenous contrast was administered.

[Series 2: T2 · sagittal · 4.0mm · 1.17mm/px · 6 of 17 slices shown (1 of 2)]
[im 1/17]
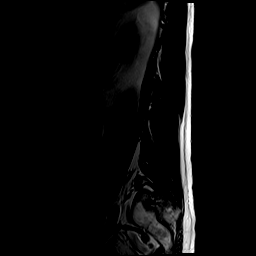
[im 4/17]
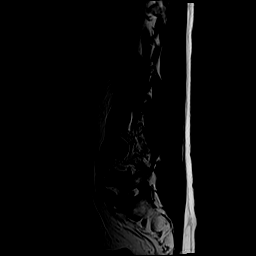
[im 7/17]
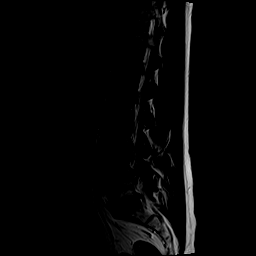
[im 10/17]
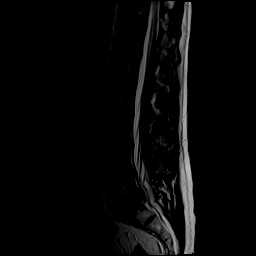
[im 13/17]
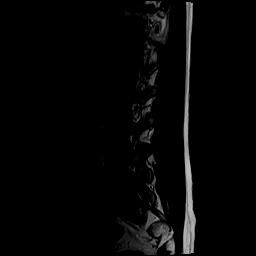
[im 17/17]
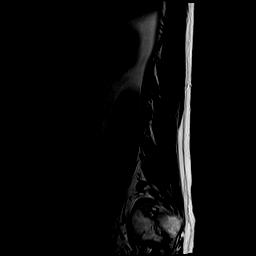

[Series 4: T1 · sagittal · 4.0mm · 1.17mm/px · 6 of 17 slices shown (1 of 2)]
[im 1/17]
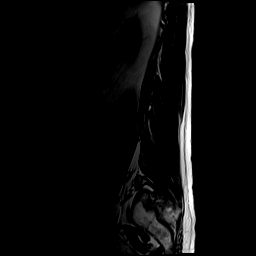
[im 4/17]
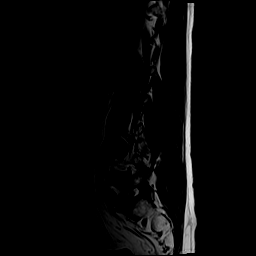
[im 7/17]
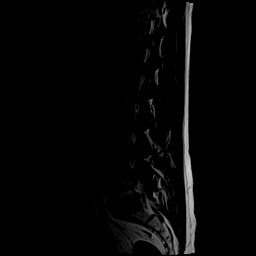
[im 10/17]
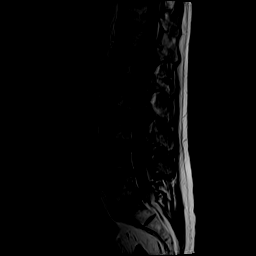
[im 13/17]
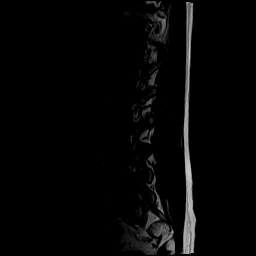
[im 17/17]
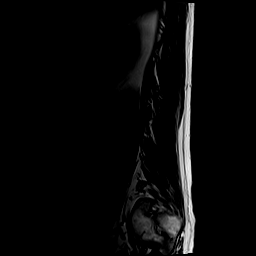

[Series 5: T2 · axial · 4.0mm · 0.39mm/px · z∈[-87,+142]mm · 9 of 44 slices shown (2 of 2)]
[im 1/44]
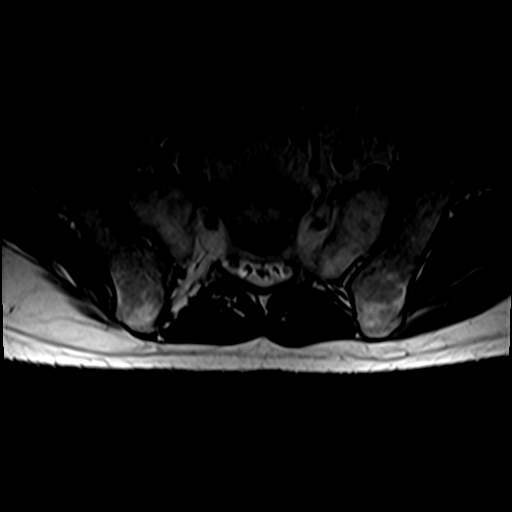
[im 7/44]
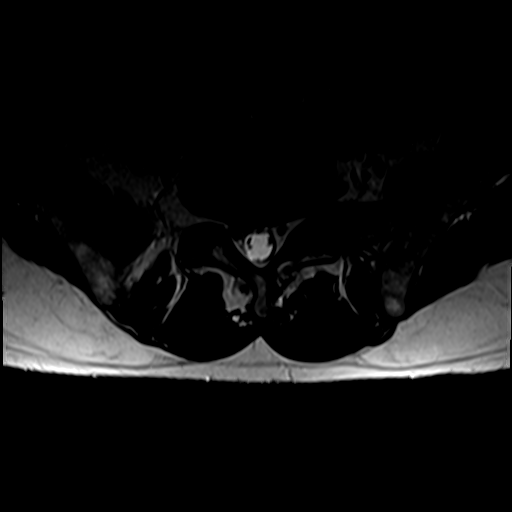
[im 13/44]
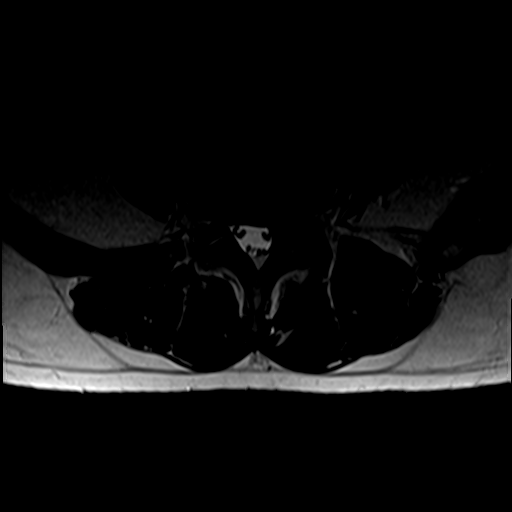
[im 19/44]
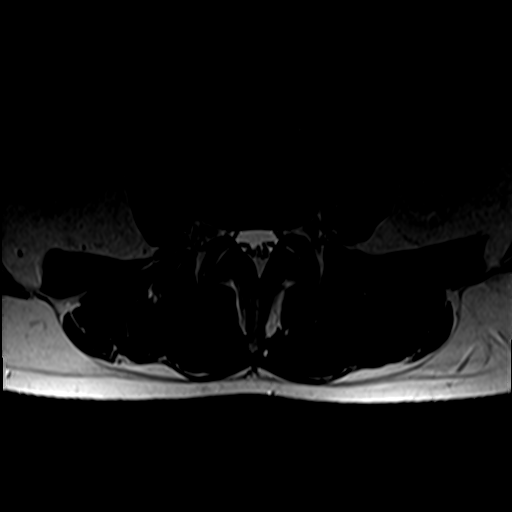
[im 22/44]
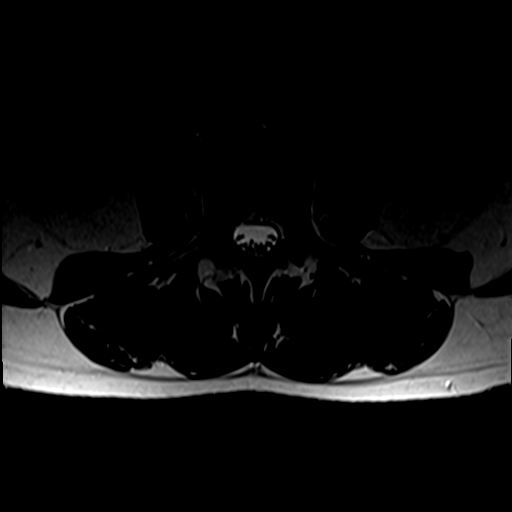
[im 25/44]
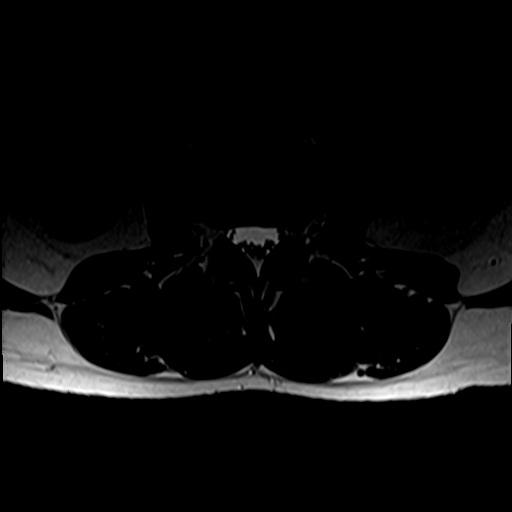
[im 31/44]
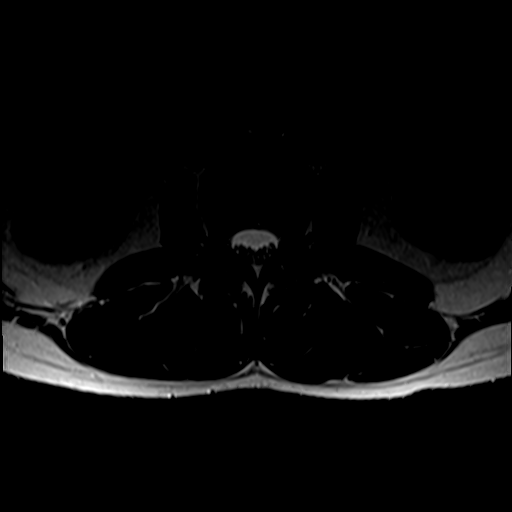
[im 37/44]
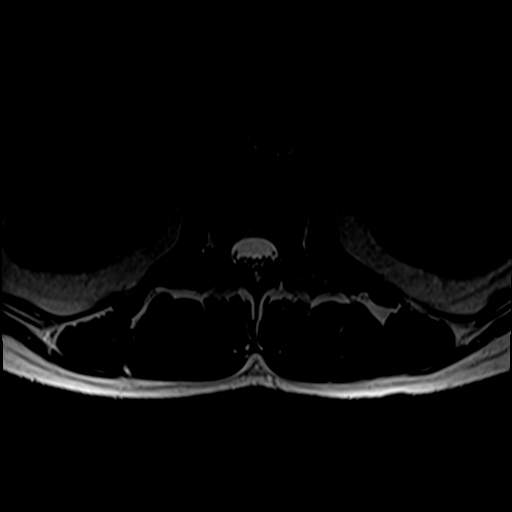
[im 44/44]
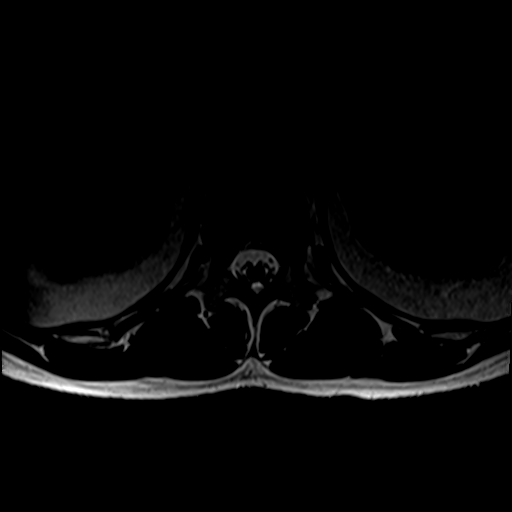

[Series 6: T1 · axial · 4.0mm · 0.39mm/px · z∈[-87,+108]mm · 6 of 44 slices shown (2 of 2)]
[im 1/44]
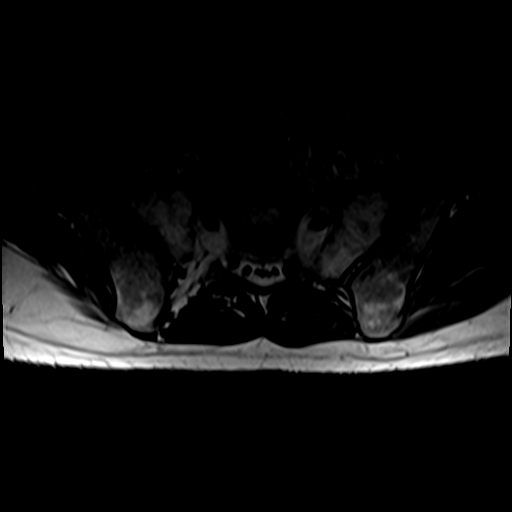
[im 7/44]
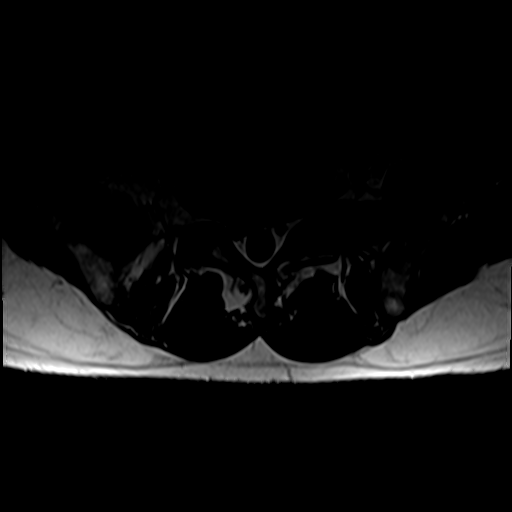
[im 13/44]
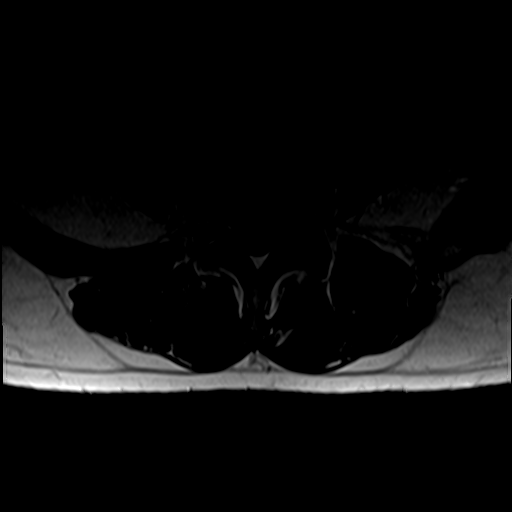
[im 19/44]
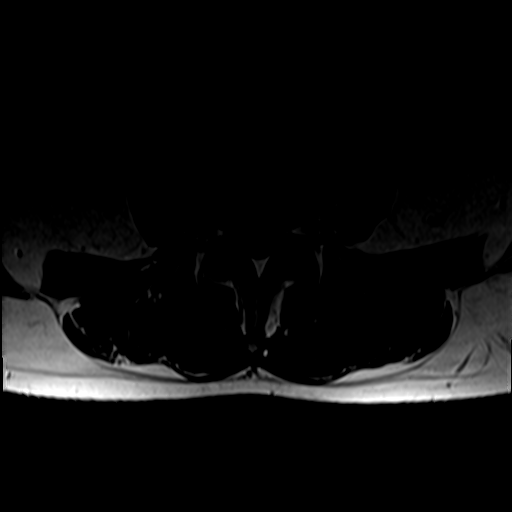
[im 22/44]
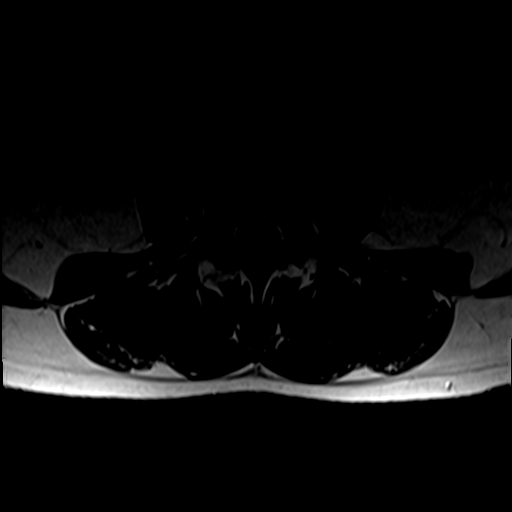
[im 37/44]
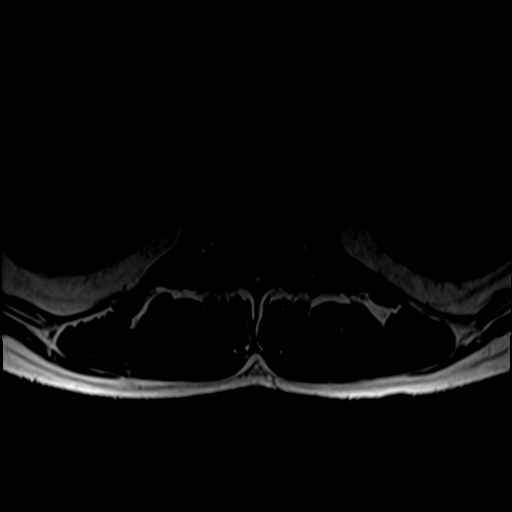

[27 of 48 positions shown; findings below may reference images not displayed]

FINDINGS: Segmentation:  5 lumbar type vertebral bodies.

Alignment:  Normal

Vertebrae: No fracture or focal bone lesion. Chronic discogenic
endplate changes at L4-5.

Conus medullaris and cauda equina: Conus extends to the T12-L1
level. Conus and cauda equina appear normal.

Paraspinal and other soft tissues: Negative

Disc levels:

No abnormality at L2-3 or above.

L3-4: Minimal desiccation and bulging of the disc.  No stenosis.

L4-5: Chronic disc degeneration with shallow protrusion slightly
more prominent towards the left. Mild facet and ligamentous
hypertrophy. Mild stenosis of the subarticular lateral recesses left
more than right, but without definite neural compression. Findings
have worsened slightly since 8380.

L5-S1: Disc degeneration with a shallow disc herniation more
prominent towards the right. Disc material adjacent to the right S1
nerve could focally compress that structure. Mild right foraminal
encroachment. These findings are newly seen since 8380.
IMPRESSION: Newly seen right posterolateral disc herniation at L5-S1 with a
small disc fragment adjacent to the right S1 nerve which could
focally irritate that structure. Mild right foraminal narrowing.

Chronic disc degeneration L4-5, slightly worsened since 8380.
Narrowing of the lateral recesses left more than right which could
possibly be symptomatic, though not as likely as the findings at
L5-S1.

## 2022-08-01 NOTE — Telephone Encounter (Signed)
PA has been submitted and documented in separate encounter, please sign off on encounter as PA team is unable to resolve RX requests. Thank you 

## 2022-08-01 NOTE — Telephone Encounter (Signed)
I do not see a note in a sepreate encounter. Please advise.

## 2022-08-20 ENCOUNTER — Encounter (INDEPENDENT_AMBULATORY_CARE_PROVIDER_SITE_OTHER): Payer: Self-pay

## 2022-09-16 ENCOUNTER — Encounter: Payer: Self-pay | Admitting: Adult Health

## 2022-09-16 ENCOUNTER — Ambulatory Visit (INDEPENDENT_AMBULATORY_CARE_PROVIDER_SITE_OTHER): Payer: BC Managed Care – PPO | Admitting: Adult Health

## 2022-09-16 VITALS — BP 138/80 | HR 74 | Temp 98.2°F | Ht 68.0 in | Wt 164.0 lb

## 2022-09-16 DIAGNOSIS — Z1211 Encounter for screening for malignant neoplasm of colon: Secondary | ICD-10-CM

## 2022-09-16 DIAGNOSIS — Z125 Encounter for screening for malignant neoplasm of prostate: Secondary | ICD-10-CM

## 2022-09-16 DIAGNOSIS — N442 Benign cyst of testis: Secondary | ICD-10-CM

## 2022-09-16 DIAGNOSIS — Z23 Encounter for immunization: Secondary | ICD-10-CM

## 2022-09-16 DIAGNOSIS — Z0000 Encounter for general adult medical examination without abnormal findings: Secondary | ICD-10-CM | POA: Diagnosis not present

## 2022-09-16 DIAGNOSIS — R972 Elevated prostate specific antigen [PSA]: Secondary | ICD-10-CM

## 2022-09-16 DIAGNOSIS — R7303 Prediabetes: Secondary | ICD-10-CM | POA: Diagnosis not present

## 2022-09-16 DIAGNOSIS — E782 Mixed hyperlipidemia: Secondary | ICD-10-CM

## 2022-09-16 DIAGNOSIS — Z720 Tobacco use: Secondary | ICD-10-CM

## 2022-09-16 LAB — COMPREHENSIVE METABOLIC PANEL
ALT: 22 U/L (ref 0–53)
AST: 21 U/L (ref 0–37)
Albumin: 4.5 g/dL (ref 3.5–5.2)
Alkaline Phosphatase: 76 U/L (ref 39–117)
BUN: 11 mg/dL (ref 6–23)
CO2: 29 mEq/L (ref 19–32)
Calcium: 9.9 mg/dL (ref 8.4–10.5)
Chloride: 102 mEq/L (ref 96–112)
Creatinine, Ser: 0.89 mg/dL (ref 0.40–1.50)
GFR: 103.15 mL/min (ref >60.00)
Glucose, Bld: 89 mg/dL (ref 70–99)
Potassium: 4.2 mEq/L (ref 3.5–5.1)
Sodium: 141 mEq/L (ref 135–145)
Total Bilirubin: 1.9 mg/dL — ABNORMAL HIGH (ref 0.2–1.2)
Total Protein: 7 g/dL (ref 6.0–8.3)

## 2022-09-16 LAB — CBC
HCT: 46.2 % (ref 39.0–52.0)
Hemoglobin: 15.4 g/dL (ref 13.0–17.0)
MCHC: 33.3 g/dL (ref 30.0–36.0)
MCV: 95.3 fl (ref 78.0–100.0)
Platelets: 267 10*3/uL (ref 150.0–400.0)
RBC: 4.85 Mil/uL (ref 4.22–5.81)
RDW: 13.9 % (ref 11.5–15.5)
WBC: 5.7 10*3/uL (ref 4.0–10.5)

## 2022-09-16 LAB — LIPID PANEL
Cholesterol: 180 mg/dL (ref 0–200)
HDL: 57 mg/dL (ref >39.00)
LDL Cholesterol: 99 mg/dL (ref 0–99)
NonHDL: 122.95
Total CHOL/HDL Ratio: 3
Triglycerides: 120 mg/dL (ref 0.0–149.0)
VLDL: 24 mg/dL (ref 0.0–40.0)

## 2022-09-16 LAB — TSH: TSH: 0.91 u[IU]/mL (ref 0.35–5.50)

## 2022-09-16 LAB — HEMOGLOBIN A1C: Hgb A1c MFr Bld: 5.6 % (ref 4.6–6.5)

## 2022-09-16 LAB — PSA: PSA: 9.78 ng/mL — ABNORMAL HIGH (ref 0.10–4.00)

## 2022-09-16 NOTE — Progress Notes (Addendum)
Subjective:    Patient ID: David Barker, male    DOB: [birth year], 47 y.o.   MRN: [id number]  HPI Patient presents for yearly preventative medicine examination. He is a pleasant 47 year old male who  has a past medical history of Back pain and Pulmonary embolism (HCC) (2006).  Tobacco Use - he has cut back on smoking about a pack a week.   Hyperlipidemia - not currently on medication  Lab Results  Component Value Date   CHOL 211 (H) 02/05/2021   HDL 63.80 02/05/2021   LDLCALC 115 (H) 02/05/2021   TRIG 161.0 (H) 02/05/2021   CHOLHDL 3 02/05/2021   Prediabetic  Lab Results  Component Value Date   HGBA1C 5.8 02/05/2021   Acutely  - He has noticed for quite sometime that he has had " hard knots" on his testicle. He denies pain, swelling, or redness. Has not seemed to enlarge over time  All immunizations and health maintenance protocols were reviewed with the patient and needed orders were placed.  Appropriate screening laboratory values were ordered for the patient including screening of hyperlipidemia, renal function and hepatic function. If indicated by BPH, a PSA was ordered.  Medication reconciliation,  past medical history, social history, problem list and allergies were reviewed in detail with the patient  Goals were established with regard to weight loss, exercise, and  diet in compliance with medications. He is not exercising on a routine basis. He is trying to eat healthy.   Wt Readings from Last 3 Encounters:  09/16/22 164 lb (74.4 kg)  01/23/22 164 lb (74.4 kg)  01/01/22 164 lb (74.4 kg)    He is due for colon cancer screening    Review of Systems  Constitutional: Negative.   HENT: Negative.    Eyes: Negative.   Respiratory: Negative.    Cardiovascular: Negative.   Gastrointestinal: Negative.   Endocrine: Negative.   Genitourinary: Negative.   Musculoskeletal: Negative.   Skin: Negative.   Allergic/Immunologic: Negative.   Neurological: Negative.    Hematological: Negative.   Psychiatric/Behavioral: Negative.    All other systems reviewed and are negative.  Past Medical History:  Diagnosis Date   Back pain    Pulmonary embolism (HCC) 2006    Social History   Socioeconomic History   Marital status: Single    Spouse name: Not on file   Number of children: Not on file   Years of education: Not on file   Highest education level: Some college, no degree  Occupational History   Not on file  Tobacco Use   Smoking status: Some Days    Current packs/day: 0.20    Average packs/day: 0.2 packs/day for 3.0 years (0.6 ttl pk-yrs)    Types: Cigarettes   Smokeless tobacco: Never  Substance and Sexual Activity   Alcohol use: Yes    Alcohol/week: 0.0 standard drinks of alcohol    Comment: occasional   Drug use: No   Sexual activity: Never    Birth control/protection: Abstinence  Other Topics Concern   Not on file  Social History Narrative   Married    Two children    Social Determinants of Health   Financial Resource Strain: Low Risk  (05/09/2021)   Overall Financial Resource Strain (CARDIA)    Difficulty of Paying Living Expenses: Not hard at all  Food Insecurity: No Food Insecurity (05/09/2021)   Hunger Vital Sign    Worried About Running Out of Food in the Last Year: Never true  Ran Out of Food in the Last Year: Never true  Transportation Needs: No Transportation Needs (05/09/2021)   PRAPARE - Administrator, Civil Service (Medical): No    Lack of Transportation (Non-Medical): No  Physical Activity: Insufficiently Active (05/09/2021)   Exercise Vital Sign    Days of Exercise per Week: 3 days    Minutes of Exercise per Session: 40 min  Stress: No Stress Concern Present (05/09/2021)   [employer] of Occupational Health - Occupational Stress Questionnaire    Feeling of Stress : Not at all  Social Connections: Moderately Isolated (05/09/2021)   Social Connection and Isolation Panel [NHANES]    Frequency  of Communication with Friends and Family: More than three times a week    Frequency of Social Gatherings with Friends and Family: More than three times a week    Attends Religious Services: 1 to 4 times per year    Active Member of [employer] or Organizations: No    Attends Engineer, structural: Not on file    Marital Status: Never married  Intimate Partner Violence: Not on file    No past surgical history on file.  Family History  Problem Relation Age of Onset   Hypertension Paternal Grandmother    Hypertension Mother    Diabetes Maternal Uncle    Diabetes Paternal Uncle     No Known Allergies  Current Outpatient Medications on File Prior to Visit  Medication Sig Dispense Refill   cyclobenzaprine (FLEXERIL) 10 MG tablet Take 1 tablet (10 mg total) by mouth 3 (three) times daily as needed for muscle spasms. (Patient not taking: Reported on 09/16/2022) 15 tablet 0   No current facility-administered medications on file prior to visit.    BP 138/80   Pulse 74   Temp 98.2 F (36.8 C) (Oral)   Ht 5\' 8"  (1.727 m)   Wt 164 lb (74.4 kg)   SpO2 98%   BMI 24.94 kg/m       Objective:   Physical Exam Vitals and nursing note reviewed.  Constitutional:      General: He is not in acute distress.    Appearance: Normal appearance. He is not ill-appearing.  HENT:     Head: Normocephalic and atraumatic.     Right Ear: Tympanic membrane, ear canal and external ear normal. There is no impacted cerumen.     Left Ear: Tympanic membrane, ear canal and external ear normal. There is no impacted cerumen.     Nose: Nose normal. No congestion or rhinorrhea.     Mouth/Throat:     Mouth: Mucous membranes are moist.     Pharynx: Oropharynx is clear.  Eyes:     Extraocular Movements: Extraocular movements intact.     Conjunctiva/sclera: Conjunctivae normal.     Pupils: Pupils are equal, round, and reactive to light.  Neck:     Vascular: No carotid bruit.  Cardiovascular:     Rate and  Rhythm: Normal rate and regular rhythm.     Pulses: Normal pulses.     Heart sounds: No murmur heard.    No friction rub. No gallop.  Pulmonary:     Effort: Pulmonary effort is normal.     Breath sounds: Normal breath sounds.  Abdominal:     General: Abdomen is flat. Bowel sounds are normal. There is no distension.     Palpations: Abdomen is soft. There is no mass.     Tenderness: There is no abdominal tenderness. There is  no guarding or rebound.     Hernia: No hernia is present.  Genitourinary:    Testes:        Right: Tenderness, swelling, testicular hydrocele or varicocele not present.        Left: Tenderness, swelling, testicular hydrocele or varicocele not present.     Comments: Multiple small cystic masses noted on testicles.  Musculoskeletal:        General: Normal range of motion.     Cervical back: Normal range of motion and neck supple.  Lymphadenopathy:     Cervical: No cervical adenopathy.  Skin:    General: Skin is warm and dry.     Capillary Refill: Capillary refill takes less than 2 seconds.  Neurological:     General: No focal deficit present.     Mental Status: He is alert and oriented to person, place, and time.  Psychiatric:        Mood and Affect: Mood normal.        Behavior: Behavior normal.        Thought Content: Thought content normal.        Judgment: Judgment normal.       Assessment & Plan:  1. Routine general medical examination at a health care facility Today patient counseled on age appropriate routine health concerns for screening and prevention, each reviewed and up to date or declined. Immunizations reviewed and up to date or declined. Labs ordered and reviewed. Risk factors for depression reviewed and negative. Hearing function and visual acuity are intact. ADLs screened and addressed as needed. Functional ability and level of safety reviewed and appropriate. Education, counseling and referrals performed based on assessed risks today. Patient  provided with a copy of personalized plan for preventive services. - Tdap given   2. Tobacco use - needs to quit smoking   3. Prostate cancer screening  - PSA; Future  4. Mixed hyperlipidemia - Consider statin  - Lipid panel; Future - TSH; Future - CBC; Future - Comprehensive metabolic panel; Future  5. Prediabetes - Consider metformin  - Lipid panel; Future - TSH; Future - CBC; Future - Comprehensive metabolic panel; Future - Hemoglobin A1c; Future  6. Colon cancer screening  - Ambulatory referral to Gastroenterology  Shirline Frees, NP

## 2022-09-16 NOTE — Patient Instructions (Addendum)
It was great seeing you today   We will follow up with you regarding your lab work   Please let me know if you need anything   Someone will call you to schedule your colonoscopy and ultrasound

## 2022-09-18 ENCOUNTER — Encounter: Payer: Self-pay | Admitting: Adult Health

## 2022-09-18 NOTE — Addendum Note (Signed)
Addended by: Waymon Amato R on: 09/18/2022 11:03 AM   Modules accepted: Orders

## 2022-09-25 ENCOUNTER — Other Ambulatory Visit: Payer: Self-pay | Admitting: Adult Health

## 2022-09-25 DIAGNOSIS — Z113 Encounter for screening for infections with a predominantly sexual mode of transmission: Secondary | ICD-10-CM

## 2022-09-30 ENCOUNTER — Ambulatory Visit
Admission: RE | Admit: 2022-09-30 | Discharge: 2022-09-30 | Disposition: A | Discharge status: Home / Self Care | Payer: BC Managed Care – PPO | Source: Ambulatory Visit | Attending: Adult Health | Admitting: Adult Health

## 2022-09-30 ENCOUNTER — Other Ambulatory Visit (HOSPITAL_COMMUNITY)
Admission: RE | Admit: 2022-09-30 | Discharge: 2022-09-30 | Disposition: A | Discharge status: Home / Self Care | Payer: BC Managed Care – PPO | Source: Ambulatory Visit | Attending: Adult Health | Admitting: Adult Health

## 2022-09-30 ENCOUNTER — Other Ambulatory Visit: Payer: BC Managed Care – PPO

## 2022-09-30 ENCOUNTER — Ambulatory Visit: Payer: BC Managed Care – PPO | Admitting: Adult Health

## 2022-09-30 DIAGNOSIS — N433 Hydrocele, unspecified: Secondary | ICD-10-CM | POA: Diagnosis not present

## 2022-09-30 DIAGNOSIS — Z113 Encounter for screening for infections with a predominantly sexual mode of transmission: Secondary | ICD-10-CM

## 2022-09-30 DIAGNOSIS — N442 Benign cyst of testis: Secondary | ICD-10-CM

## 2022-10-01 LAB — RPR: RPR Ser Ql: NONREACTIVE

## 2022-10-01 LAB — HIV ANTIBODY (ROUTINE TESTING W REFLEX): HIV 1&2 Ab, 4th Generation: NONREACTIVE

## 2022-10-02 LAB — URINE CYTOLOGY ANCILLARY ONLY
Chlamydia: NEGATIVE
Comment: NEGATIVE
Comment: NORMAL
Neisseria Gonorrhea: NEGATIVE

## 2022-11-05 ENCOUNTER — Ambulatory Visit (AMBULATORY_SURGERY_CENTER): Payer: BC Managed Care – PPO

## 2022-11-05 VITALS — Ht 68.0 in | Wt 170.0 lb

## 2022-11-05 DIAGNOSIS — Z1211 Encounter for screening for malignant neoplasm of colon: Secondary | ICD-10-CM

## 2022-11-05 MED ORDER — NA SULFATE-K SULFATE-MG SULF 17.5-3.13-1.6 GM/177ML PO SOLN
1.0000 | Freq: Once | ORAL | 0 refills | Status: AC
Start: 2022-11-05 — End: 2022-11-05

## 2022-11-05 NOTE — Progress Notes (Signed)

## 2022-11-13 ENCOUNTER — Encounter: Payer: Self-pay | Admitting: Gastroenterology

## 2022-11-20 ENCOUNTER — Encounter: Payer: Self-pay | Admitting: Certified Registered Nurse Anesthetist

## 2022-11-26 ENCOUNTER — Encounter: Payer: Self-pay | Admitting: Gastroenterology

## 2022-11-26 ENCOUNTER — Encounter: Payer: BC Managed Care – PPO | Admitting: Gastroenterology

## 2022-11-26 ENCOUNTER — Ambulatory Visit (AMBULATORY_SURGERY_CENTER): Payer: BC Managed Care – PPO | Admitting: Gastroenterology

## 2022-11-26 VITALS — BP 126/87 | HR 89 | Temp 98.6°F | Resp 13 | Ht 68.0 in | Wt 170.0 lb

## 2022-11-26 DIAGNOSIS — Z1211 Encounter for screening for malignant neoplasm of colon: Secondary | ICD-10-CM | POA: Diagnosis not present

## 2022-11-26 DIAGNOSIS — K640 First degree hemorrhoids: Secondary | ICD-10-CM

## 2022-11-26 MED ORDER — SODIUM CHLORIDE 0.9 % IV SOLN: 500.0000 mL | Freq: Once | INTRAVENOUS | Status: DC

## 2022-11-26 NOTE — Progress Notes (Signed)
Pt's states no medical or surgical changes since previsit or office visit. 

## 2022-11-26 NOTE — Patient Instructions (Signed)
Resume previous diet.  Continue present medications.     YOU HAD AN ENDOSCOPIC PROCEDURE TODAY AT THE [facility]:   Refer to the procedure report that was given to you for any specific questions about what was found during the examination.  If the procedure report does not answer your questions, please call your gastroenterologist to clarify.  If you requested that your care partner not be given the details of your procedure findings, then the procedure report has been included in a sealed envelope for you to review at your convenience later.  YOU SHOULD EXPECT: Some feelings of bloating in the abdomen. Passage of more gas than usual.  Walking can help get rid of the air that was put into your GI tract during the procedure and reduce the bloating. If you had a lower endoscopy (such as a colonoscopy or flexible sigmoidoscopy) you may notice spotting of blood in your stool or on the toilet paper. If you underwent a bowel prep for your procedure, you may not have a normal bowel movement for a few days.  Please Note:  You might notice some irritation and congestion in your nose or some drainage.  This is from the oxygen used during your procedure.  There is no need for concern and it should clear up in a day or so.  SYMPTOMS TO REPORT IMMEDIATELY:  Following lower endoscopy (colonoscopy or flexible sigmoidoscopy):  Excessive amounts of blood in the stool  Significant tenderness or worsening of abdominal pains  Swelling of the abdomen that is new, acute  Fever of 100F or higher   For urgent or emergent issues, a gastroenterologist can be reached at any hour by calling [phone]. Do not use MyChart messaging for urgent concerns.    DIET:  We do recommend a small meal at first, but then you may proceed to your regular diet.  Drink plenty of fluids but you should avoid alcoholic beverages for 24 hours.  ACTIVITY:  You should plan to take it easy for the rest of today and you  should NOT DRIVE or use heavy machinery until tomorrow (because of the sedation medicines used during the test).    FOLLOW UP: Our staff will call the number listed on your records the next business day following your procedure.  We will call around 7:15- 8:00 am to check on you and address any questions or concerns that you may have regarding the information given to you following your procedure. If we do not reach you, we will leave a message.     If any biopsies were taken you will be contacted by phone or by letter within the next 1-3 weeks.  Please call us at [phone] if you have not heard about the biopsies in 3 weeks.    SIGNATURES/CONFIDENTIALITY: You and/or your care partner have signed paperwork which will be entered into your electronic medical record.  These signatures attest to the fact that that the information above on your After Visit Summary has been reviewed and is understood.  Full responsibility of the confidentiality of this discharge information lies with you and/or your care-partner.

## 2022-11-26 NOTE — Progress Notes (Signed)
Report given to PACU, vss 

## 2022-11-26 NOTE — Progress Notes (Signed)
GASTROENTEROLOGY PROCEDURE H&P NOTE   Primary Care Physician: Shirline Frees, NP    Reason for Procedure:  Colon Cancer screening  Plan:    Colonoscopy  Patient is appropriate for endoscopic procedure(s) in the ambulatory ([facility]) setting.  The nature of the procedure, as well as the risks, benefits, and alternatives were carefully and thoroughly reviewed with the patient. Ample time for discussion and questions allowed. The patient understood, was satisfied, and agreed to proceed.     HPI: David Barker is a 47 y.o. male who presents for colonoscopy for routine Colon Cancer screening.  No active GI symptoms.  No known family history of colon cancer or related malignancy.  Patient is otherwise without complaints or active issues today.  Past Medical History:  Diagnosis Date   Back pain    Pulmonary embolism (HCC) 2006    Past Surgical History:  Procedure Laterality Date   CYST REMOVAL HAND      Prior to Admission medications   Medication Sig Start Date End Date Taking? Authorizing Provider  cyclobenzaprine (FLEXERIL) 10 MG tablet Take 10 mg by mouth as needed for muscle spasms. Last dose taken on 11/01/22    [provider]    Current Outpatient Medications  Medication Sig Dispense Refill   cyclobenzaprine (FLEXERIL) 10 MG tablet Take 10 mg by mouth as needed for muscle spasms. Last dose taken on 11/01/22     No current facility-administered medications for this visit.    Allergies as of 11/26/2022   (No Known Allergies)    Family History  Problem Relation Age of Onset   Hypertension Mother    Diabetes Maternal Uncle    Diabetes Paternal Uncle    Hypertension Paternal Grandmother    Colon cancer Neg Hx    Esophageal cancer Neg Hx    Rectal cancer Neg Hx    Stomach cancer Neg Hx     Social History   Socioeconomic History   Marital status: Single    Spouse name: Not on file   Number of children: Not on file   Years of education: Not on file    Highest education level: Some college, no degree  Occupational History   Not on file  Tobacco Use   Smoking status: Some Days    Current packs/day: 0.20    Average packs/day: 0.2 packs/day for 3.0 years (0.6 ttl pk-yrs)    Types: Cigarettes   Smokeless tobacco: Never  Vaping Use   Vaping status: Never Used  Substance and Sexual Activity   Alcohol use: Yes    Alcohol/week: 0.0 standard drinks of alcohol    Comment: occasional   Drug use: No   Sexual activity: Never    Birth control/protection: Abstinence  Other Topics Concern   Not on file  Social History Narrative   Married    Two children    Social Determinants of Health   Financial Resource Strain: Low Risk  (05/09/2021)   Overall Financial Resource Strain (CARDIA)    Difficulty of Paying Living Expenses: Not hard at all  Food Insecurity: No Food Insecurity (05/09/2021)   Hunger Vital Sign    Worried About Running Out of Food in the Last Year: Never true    Ran Out of Food in the Last Year: Never true  Transportation Needs: No Transportation Needs (05/09/2021)   PRAPARE - Administrator, Civil Service (Medical): No    Lack of Transportation (Non-Medical): No  Physical Activity: Insufficiently Active (05/09/2021)   Exercise  Vital Sign    Days of Exercise per Week: 3 days    Minutes of Exercise per Session: 40 min  Stress: No Stress Concern Present (05/09/2021)   [employer] of Occupational Health - Occupational Stress Questionnaire    Feeling of Stress : Not at all  Social Connections: Moderately Isolated (05/09/2021)   Social Connection and Isolation Panel [NHANES]    Frequency of Communication with Friends and Family: More than three times a week    Frequency of Social Gatherings with Friends and Family: More than three times a week    Attends Religious Services: 1 to 4 times per year    Active Member of [employer] or Organizations: No    Attends Engineer, structural: Not on file    Marital Status:  Never married  Intimate Partner Violence: Not on file    Physical Exam: Vital signs in last 24 hours: @There  were no vitals taken for this visit. GEN: NAD EYE: Sclerae anicteric ENT: MMM CV: Non-tachycardic Pulm: CTA b/l GI: Soft, NT/ND NEURO:  Alert & Oriented x 3   Doristine Locks, DO [facility]   11/26/2022 9:24 AM

## 2022-11-26 NOTE — Op Note (Signed)
[facility] Patient Name: David Barker Reason Procedure Date: 11/26/2022 10:35 AM MRN: [id number] Endoscopist: Doristine Locks , MD, [id number] Age: 47 Referring MD:  Date of Birth: [birth year] Gender: Male Account #: [id number] Procedure:                Colonoscopy Indications:              Screening for colorectal malignant neoplasm, This                            is the patient's first colonoscopy Medicines:                Monitored Anesthesia Care Procedure:                Pre-Anesthesia Assessment:                           - Prior to the procedure, a History and Physical                            was performed, and patient medications and                            allergies were reviewed. The patient's tolerance of                            previous anesthesia was also reviewed. The risks                            and benefits of the procedure and the sedation                            options and risks were discussed with the patient.                            All questions were answered, and informed consent                            was obtained. Prior Anticoagulants: The patient has                            taken no anticoagulant or antiplatelet agents. ASA                            Grade Assessment: I - A normal, healthy patient.                            After reviewing the risks and benefits, the patient                            was deemed in satisfactory condition to undergo the                            procedure.  After obtaining informed consent, the colonoscope                            was passed under direct vision. Throughout the                            procedure, the patient's blood pressure, pulse, and                            oxygen saturations were monitored continuously. The                            Olympus Scope SN: [id number] was introduced through                            the anus and advanced to the the  cecum, identified                            by appendiceal orifice and ileocecal valve. The                            colonoscopy was performed without difficulty. The                            patient tolerated the procedure well. The quality                            of the bowel preparation was good. The ileocecal                            valve, appendiceal orifice, and rectum were                            photographed. Scope In: 10:49:56 AM Scope Out: 10:59:18 AM Scope Withdrawal Time: 0 hours 7 minutes 12 seconds  Total Procedure Duration: 0 hours 9 minutes 22 seconds  Findings:                 The perianal and digital rectal examinations were                            normal.                           The entire colon appeared normal.                           Non-bleeding internal hemorrhoids were found during                            retroflexion. The hemorrhoids were small. Complications:            No immediate complications. Estimated Blood Loss:     Estimated blood loss: none. Impression:               - The entire examined colon is normal.                           -  Non-bleeding internal hemorrhoids.                           - No specimens collected. Recommendation:           - Patient has a contact number available for                            emergencies. The signs and symptoms of potential                            delayed complications were discussed with the                            patient. Return to normal activities tomorrow.                            Written discharge instructions were provided to the                            patient.                           - Resume previous diet.                           - Continue present medications.                           - Repeat colonoscopy in 10 years for screening                            purposes.                           - Return to GI office PRN. Doristine Locks, MD 11/26/2022 11:05:29  AM

## 2022-11-27 ENCOUNTER — Telehealth: Payer: Self-pay

## 2022-11-27 NOTE — Telephone Encounter (Signed)
  Follow up Call-     11/26/2022    9:54 AM  Call back number  Post procedure Call Back phone  # [phone]  Permission to leave phone message Yes     Patient questions:  Do you have a fever, pain , or abdominal swelling? No. Pain Score  0 *  Have you tolerated food without any problems? Yes.    Have you been able to return to your normal activities? Yes.    Do you have any questions about your discharge instructions: Diet   No. Medications  No. Follow up visit  No.  Do you have questions or concerns about your Care? No.  Actions: * If pain score is 4 or above: No action needed, pain <4.

## 2022-12-02 ENCOUNTER — Encounter: Payer: Self-pay | Admitting: Adult Health

## 2023-10-15 ENCOUNTER — Ambulatory Visit (INDEPENDENT_AMBULATORY_CARE_PROVIDER_SITE_OTHER): Admitting: Adult Health

## 2023-10-15 ENCOUNTER — Encounter: Payer: Self-pay | Admitting: Adult Health

## 2023-10-15 ENCOUNTER — Ambulatory Visit: Payer: Self-pay | Admitting: Adult Health

## 2023-10-15 ENCOUNTER — Other Ambulatory Visit (HOSPITAL_COMMUNITY)
Admission: RE | Admit: 2023-10-15 | Discharge: 2023-10-15 | Disposition: A | Discharge status: Home / Self Care | Source: Ambulatory Visit | Attending: Adult Health | Admitting: Adult Health

## 2023-10-15 VITALS — BP 100/60 | HR 72 | Temp 96.0°F | Ht 68.0 in | Wt 165.0 lb

## 2023-10-15 DIAGNOSIS — Z720 Tobacco use: Secondary | ICD-10-CM

## 2023-10-15 DIAGNOSIS — Z0000 Encounter for general adult medical examination without abnormal findings: Secondary | ICD-10-CM | POA: Diagnosis not present

## 2023-10-15 DIAGNOSIS — R7303 Prediabetes: Secondary | ICD-10-CM | POA: Diagnosis not present

## 2023-10-15 DIAGNOSIS — R972 Elevated prostate specific antigen [PSA]: Secondary | ICD-10-CM

## 2023-10-15 DIAGNOSIS — E782 Mixed hyperlipidemia: Secondary | ICD-10-CM

## 2023-10-15 DIAGNOSIS — Z113 Encounter for screening for infections with a predominantly sexual mode of transmission: Secondary | ICD-10-CM | POA: Insufficient documentation

## 2023-10-15 LAB — CBC
HCT: 43.9 % (ref 39.0–52.0)
Hemoglobin: 14.4 g/dL (ref 13.0–17.0)
MCHC: 32.8 g/dL (ref 30.0–36.0)
MCV: 95 fl (ref 78.0–100.0)
Platelets: 261 K/uL (ref 150.0–400.0)
RBC: 4.62 Mil/uL (ref 4.22–5.81)
RDW: 14.5 % (ref 11.5–15.5)
WBC: 5.9 K/uL (ref 4.0–10.5)

## 2023-10-15 LAB — COMPREHENSIVE METABOLIC PANEL WITH GFR
ALT: 19 U/L (ref 0–53)
AST: 14 U/L (ref 0–37)
Albumin: 4.5 g/dL (ref 3.5–5.2)
Alkaline Phosphatase: 73 U/L (ref 39–117)
BUN: 22 mg/dL (ref 6–23)
CO2: 27 meq/L (ref 19–32)
Calcium: 9.5 mg/dL (ref 8.4–10.5)
Chloride: 106 meq/L (ref 96–112)
Creatinine, Ser: 0.82 mg/dL (ref 0.40–1.50)
GFR: 104.93 mL/min (ref >60.00)
Glucose, Bld: 93 mg/dL (ref 70–99)
Potassium: 4.5 meq/L (ref 3.5–5.1)
Sodium: 140 meq/L (ref 135–145)
Total Bilirubin: 0.6 mg/dL (ref 0.2–1.2)
Total Protein: 6.6 g/dL (ref 6.0–8.3)

## 2023-10-15 LAB — PSA: PSA: 7.53 ng/mL — ABNORMAL HIGH (ref 0.10–4.00)

## 2023-10-15 LAB — HEMOGLOBIN A1C: Hgb A1c MFr Bld: 5.8 % (ref 4.6–6.5)

## 2023-10-15 LAB — LIPID PANEL
Cholesterol: 174 mg/dL (ref 0–200)
HDL: 48.6 mg/dL (ref >39.00)
LDL Cholesterol: 91 mg/dL (ref 0–99)
NonHDL: 125.62
Total CHOL/HDL Ratio: 4
Triglycerides: 172 mg/dL — ABNORMAL HIGH (ref 0.0–149.0)
VLDL: 34.4 mg/dL (ref 0.0–40.0)

## 2023-10-15 LAB — TSH: TSH: 1.48 u[IU]/mL (ref 0.35–5.50)

## 2023-10-15 MED ORDER — VARENICLINE TARTRATE 1 MG PO TABS
1.0000 mg | ORAL_TABLET | Freq: Two times a day (BID) | ORAL | 2 refills | Status: AC
Start: 2023-10-15 — End: 2023-11-14

## 2023-10-15 MED ORDER — VARENICLINE TARTRATE (STARTER) 0.5 MG X 11 & 1 MG X 42 PO TBPK
ORAL_TABLET | ORAL | 0 refills | Status: AC
Start: 2023-10-15 — End: ?

## 2023-10-15 NOTE — Progress Notes (Signed)
 Subjective:    Patient ID: David Barker, male    DOB: November 22, 1975, 48 y.o.   MRN: [id number]  HPI Patient presents for yearly preventative medicine examination. He is a pleasant 48 year old male who  has a past medical history of Back pain, Pulmonary embolism (HCC) (2006), Tobacco abuse, and Tobacco chew use.  Tobacco Use - He has increased his smoking. Tries to cut cold [place] but this lasted two days. He is ready to try Chantix .   Hyperlipidemia - not currently on medication  Lab Results  Component Value Date   CHOL 180 09/16/2022   HDL 57.00 09/16/2022   LDLCALC 99 09/16/2022   TRIG 120.0 09/16/2022   CHOLHDL 3 09/16/2022   Prediabetic -  not currently on medication.  Lab Results  Component Value Date   HGBA1C 5.6 09/16/2022   HGBA1C 5.8 02/05/2021   HGBA1C 5.7 07/09/2017   Elevated PSA -  His PSA was elevated at his last CPE. He was referred to Urology  Lab Results  Component Value Date   PSA 9.78 (H) 09/16/2022    All immunizations and health maintenance protocols were reviewed with the patient and needed orders were placed. Refused Flu shot.   Appropriate screening laboratory values were ordered for the patient including screening of hyperlipidemia, renal function and hepatic function. If indicated by BPH, a PSA was ordered.  Medication reconciliation,  past medical history, social history, problem list and allergies were reviewed in detail with the patient  Goals were established with regard to weight loss, exercise, and  diet in compliance with medications. He is exercising about 3 times a week.  Wt Readings from Last 3 Encounters:  10/15/23 165 lb (74.8 kg)  11/26/22 170 lb (77.1 kg)  11/05/22 170 lb (77.1 kg)   He is up to date on colon cancer screening  Review of Systems  Constitutional: Negative.   HENT: Negative.    Eyes: Negative.   Respiratory: Negative.    Cardiovascular: Negative.   Gastrointestinal: Negative.   Endocrine: Negative.    Genitourinary: Negative.   Musculoskeletal: Negative.   Skin: Negative.   Allergic/Immunologic: Negative.   Neurological: Negative.   Hematological: Negative.   Psychiatric/Behavioral: Negative.    All other systems reviewed and are negative.  Past Medical History:  Diagnosis Date   Back pain    Pulmonary embolism (HCC) 2006   Tobacco abuse    Tobacco chew use     Social History   Socioeconomic History   Marital status: Single    Spouse name: Not on file   Number of children: Not on file   Years of education: Not on file   Highest education level: Associate degree: occupational, Scientist, product/process development, or vocational program  Occupational History   Not on file  Tobacco Use   Smoking status: Some Days    Current packs/day: 0.20    Average packs/day: 0.2 packs/day for 3.0 years (0.6 ttl pk-yrs)    Types: Cigarettes   Smokeless tobacco: Never  Vaping Use   Vaping status: Never Used  Substance and Sexual Activity   Alcohol use: Yes    Alcohol/week: 0.0 standard drinks of alcohol    Comment: occasional   Drug use: No   Sexual activity: Never    Birth control/protection: Abstinence  Other Topics Concern   Not on file  Social History Narrative   Married    Two children    Social Drivers of Health   Financial Resource Strain: Low Risk  (10/14/2023)  Overall Financial Resource Strain (CARDIA)    Difficulty of Paying Living Expenses: Not hard at all  Food Insecurity: No Food Insecurity (10/14/2023)   Hunger Vital Sign    Worried About Running Out of Food in the Last Year: Never true    Ran Out of Food in the Last Year: Never true  Transportation Needs: No Transportation Needs (10/14/2023)   PRAPARE - Administrator, Civil Service (Medical): No    Lack of Transportation (Non-Medical): No  Physical Activity: Sufficiently Active (10/14/2023)   Exercise Vital Sign    Days of Exercise per Week: 4 days    Minutes of Exercise per Session: 60 min  Stress: No Stress Concern  Present (10/14/2023)   [employer] of Occupational Health - Occupational Stress Questionnaire    Feeling of Stress: Not at all  Social Connections: Socially Isolated (10/14/2023)   Social Connection and Isolation Panel    Frequency of Communication with Friends and Family: More than three times a week    Frequency of Social Gatherings with Friends and Family: Three times a week    Attends Religious Services: Patient declined    Active Member of Clubs or Organizations: No    Attends Engineer, structural: Not on file    Marital Status: Never married  Catering manager Violence: Not on file    Past Surgical History:  Procedure Laterality Date   CYST REMOVAL HAND      Family History  Problem Relation Age of Onset   Hypertension Mother    Diabetes Maternal Uncle    Diabetes Paternal Uncle    Hypertension Paternal Grandmother    Colon cancer Neg Hx    Esophageal cancer Neg Hx    Rectal cancer Neg Hx    Stomach cancer Neg Hx     No Known Allergies  Current Outpatient Medications on File Prior to Visit  Medication Sig Dispense Refill   cyclobenzaprine  (FLEXERIL ) 10 MG tablet Take 10 mg by mouth as needed for muscle spasms. Last dose taken on 11/01/22     No current facility-administered medications on file prior to visit.    BP 100/60   Pulse 72   Temp (!) 96 F (35.6 C) (Oral)   Ht 5' 8 (1.727 m)   Wt 165 lb (74.8 kg)   SpO2 96%   BMI 25.09 kg/m       Objective:   Physical Exam Vitals and nursing note reviewed.  Constitutional:      General: He is not in acute distress.    Appearance: Normal appearance. He is not ill-appearing.  HENT:     Head: Normocephalic and atraumatic.     Right Ear: Tympanic membrane, ear canal and external ear normal. There is no impacted cerumen.     Left Ear: Tympanic membrane, ear canal and external ear normal. There is no impacted cerumen.     Nose: Nose normal. No congestion or rhinorrhea.     Mouth/Throat:     Mouth:  Mucous membranes are moist.     Pharynx: Oropharynx is clear.  Eyes:     Extraocular Movements: Extraocular movements intact.     Conjunctiva/sclera: Conjunctivae normal.     Pupils: Pupils are equal, round, and reactive to light.  Neck:     Vascular: No carotid bruit.  Cardiovascular:     Rate and Rhythm: Normal rate and regular rhythm.     Pulses: Normal pulses.     Heart sounds: No murmur heard.  No friction rub. No gallop.  Pulmonary:     Effort: Pulmonary effort is normal.     Breath sounds: Normal breath sounds.  Abdominal:     General: Abdomen is flat. Bowel sounds are normal. There is no distension.     Palpations: Abdomen is soft. There is no mass.     Tenderness: There is no abdominal tenderness. There is no guarding or rebound.     Hernia: No hernia is present.  Musculoskeletal:        General: Normal range of motion.     Cervical back: Normal range of motion and neck supple.  Lymphadenopathy:     Cervical: No cervical adenopathy.  Skin:    General: Skin is warm and dry.     Capillary Refill: Capillary refill takes less than 2 seconds.  Neurological:     General: No focal deficit present.     Mental Status: He is alert and oriented to person, place, and time.  Psychiatric:        Mood and Affect: Mood normal.        Behavior: Behavior normal.        Thought Content: Thought content normal.        Judgment: Judgment normal.       Assessment & Plan:  1. Routine general medical examination at a health care facility (Primary) Today patient counseled on age appropriate routine health concerns for screening and prevention, each reviewed and up to date or declined. Immunizations reviewed and up to date or declined. Labs ordered and reviewed. Risk factors for depression reviewed and negative. Hearing function and visual acuity are intact. ADLs screened and addressed as needed. Functional ability and level of safety reviewed and appropriate. Education, counseling and  referrals performed based on assessed risks today. Patient provided with a copy of personalized plan for preventive services. - Refused flu shot - Stay active and eat health y - Follow up in one year or sooner if needed  2. Tobacco use - Varenicline  Tartrate, Starter, (CHANTIX  STARTING MONTH PAK) 0.5 MG X 11 & 1 MG X 42 TBPK; Take as directed  Dispense: 53 each; Refill: 0 - varenicline  (CHANTIX  CONTINUING MONTH PAK) 1 MG tablet; Take 1 tablet (1 mg total) by mouth 2 (two) times daily.  Dispense: 60 tablet; Refill: 2 - Follow up in 3 months to see how he is doing.   3. Mixed hyperlipidemia - Consider statin  - Lipid panel; Future - TSH; Future - CBC; Future - Comprehensive metabolic panel with GFR; Future  4. Prediabetes - Consider metformin  - Lipid panel; Future - TSH; Future - CBC; Future - Comprehensive metabolic panel with GFR; Future - Hemoglobin A1c; Future  5. Elevated PSA - Consider referral back to Urology  - PSA; Future  6. Screening for STD (sexually transmitted disease) - Denies symptoms but would like to be tested  - HIV Antibody (routine testing w rflx); Future - RPR; Future - Urine cytology ancillary only  Darleene Shape, NP

## 2023-10-15 NOTE — Patient Instructions (Signed)
 It was great seeing you today   We will follow up with you regarding your lab work   Please let me know if you need anything

## 2023-10-16 LAB — URINE CYTOLOGY ANCILLARY ONLY
Chlamydia: NEGATIVE
Comment: NEGATIVE
Comment: NEGATIVE
Comment: NORMAL
Neisseria Gonorrhea: NEGATIVE
Trichomonas: NEGATIVE

## 2023-10-16 LAB — HIV ANTIBODY (ROUTINE TESTING W REFLEX)
HIV 1&2 Ab, 4th Generation: NONREACTIVE
HIV FINAL INTERPRETATION: NEGATIVE

## 2023-10-16 LAB — RPR: RPR Ser Ql: NONREACTIVE

## 2023-11-03 ENCOUNTER — Encounter: Payer: Self-pay | Admitting: Adult Health
